# Patient Record
Sex: Male | Born: 1990 | Race: Black or African American | Hispanic: No | Marital: Married | Smoking: Never smoker
Health system: Southern US, Community
[De-identification: ages and names within clinical notes are randomized; demographics above are authoritative.]

## PROBLEM LIST (undated history)

## (undated) DIAGNOSIS — R569 Unspecified convulsions: Secondary | ICD-10-CM

---
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
## 2010-05-25 LAB — CBC
HCT: 39.7 % (ref 39.0–52.0)
Hemoglobin: 13.7 g/dL (ref 13.0–17.0)
MCHC: 34.6 g/dL (ref 30.0–36.0)
MCV: 83.4 fL (ref 78.0–100.0)
Platelets: 134 10*3/uL — ABNORMAL LOW (ref 150–400)
RBC: 4.76 MIL/uL (ref 4.22–5.81)
RDW: 13.1 % (ref 11.5–15.5)
WBC: 4.1 10*3/uL (ref 4.0–10.5)

## 2010-05-25 LAB — DIFFERENTIAL
Basophils Absolute: 0 10*3/uL (ref 0.0–0.1)
Basophils Relative: 0 % (ref 0–1)
Eosinophils Absolute: 0.1 10*3/uL (ref 0.0–0.7)
Eosinophils Relative: 3 % (ref 0–5)
Lymphocytes Relative: 26 % (ref 12–46)
Lymphs Abs: 1.1 10*3/uL (ref 0.7–4.0)
Monocytes Absolute: 0.6 10*3/uL (ref 0.1–1.0)
Monocytes Relative: 15 % — ABNORMAL HIGH (ref 3–12)
Neutro Abs: 2.3 10*3/uL (ref 1.7–7.7)
Neutrophils Relative %: 56 % (ref 43–77)

## 2010-05-25 LAB — VALPROIC ACID LEVEL: Valproic Acid Lvl: 88 ug/mL (ref 50.0–100.0)

## 2010-05-25 LAB — BASIC METABOLIC PANEL WITH GFR
BUN: 9 mg/dL (ref 6–23)
CO2: 28 meq/L (ref 19–32)
Calcium: 9 mg/dL (ref 8.4–10.5)
Chloride: 105 meq/L (ref 96–112)
Creatinine, Ser: 0.87 mg/dL (ref 0.4–1.5)
GFR calc non Af Amer: >60 mL/min
Glucose, Bld: 88 mg/dL (ref 70–99)
Potassium: 3.7 meq/L (ref 3.5–5.1)
Sodium: 139 meq/L (ref 135–145)

## 2010-05-27 LAB — VALPROIC ACID LEVEL: Valproic Acid Lvl: 49 ug/mL — ABNORMAL LOW (ref 50.0–100.0)

## 2010-06-01 LAB — COMPREHENSIVE METABOLIC PANEL
ALT: 18 U/L (ref 0–53)
AST: 21 U/L (ref 0–37)
Albumin: 4 g/dL (ref 3.5–5.2)
Alkaline Phosphatase: 72 U/L (ref 39–117)
BUN: 9 mg/dL (ref 6–23)
CO2: 26 mEq/L (ref 19–32)
Calcium: 9.5 mg/dL (ref 8.4–10.5)
Chloride: 103 mEq/L (ref 96–112)
Creatinine, Ser: 1.02 mg/dL (ref 0.4–1.5)
GFR calc Af Amer: >60 mL/min (ref >60)
GFR calc non Af Amer: >60 mL/min (ref >60)
Glucose, Bld: 103 mg/dL — ABNORMAL HIGH (ref 70–99)
Potassium: 4 mEq/L (ref 3.5–5.1)
Sodium: 138 mEq/L (ref 135–145)
Total Bilirubin: 0.5 mg/dL (ref 0.3–1.2)
Total Protein: 7.1 g/dL (ref 6.0–8.3)

## 2010-06-01 LAB — RAPID URINE DRUG SCREEN, HOSP PERFORMED
Amphetamines: NOT DETECTED
Barbiturates: NOT DETECTED
Opiates: NOT DETECTED

## 2010-06-01 LAB — CBC
HCT: 41.5 % (ref 39.0–52.0)
Hemoglobin: 14.4 g/dL (ref 13.0–17.0)
MCHC: 34.8 g/dL (ref 30.0–36.0)
MCV: 83.8 fL (ref 78.0–100.0)
Platelets: 172 10*3/uL (ref 150–400)
RBC: 4.96 MIL/uL (ref 4.22–5.81)
RDW: 12.7 % (ref 11.5–15.5)
WBC: 7 10*3/uL (ref 4.0–10.5)

## 2010-06-01 LAB — DIFFERENTIAL
Basophils Relative: 0 % (ref 0–1)
Eosinophils Absolute: 0.3 10*3/uL (ref 0.0–0.7)
Lymphs Abs: 2.5 10*3/uL (ref 0.7–4.0)
Monocytes Relative: 9 % (ref 3–12)
Neutro Abs: 3.6 10*3/uL (ref 1.7–7.7)
Neutrophils Relative %: 51 % (ref 43–77)

## 2010-06-01 LAB — PHENYTOIN LEVEL, TOTAL: Phenytoin Lvl: <2.5 ug/mL — ABNORMAL LOW (ref 10.0–20.0)
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
## 2010-07-25 NOTE — Discharge Summary (Signed)
NAME:  SHAH, INSLEY                     ACCOUNT NO.:  [id number]   MEDICAL RECORD NO.:  [id number]                   PATIENT TYPE:  INP   LOCATION:  0601                                 FACILITY:  [facility]   PHYSICIAN:  Cindie Crumbly, M.D.               DATE OF BIRTH:  [birth year]   DATE OF ADMISSION:  06/08/2002  DATE OF DISCHARGE:  06/15/2002                                 DISCHARGE SUMMARY   REASON FOR ADMISSION:  This 20 year old African-American male was admitted  complaining of depression status post suicide attempt by trying to hang  himself with an electrical cord.  For further history of present illness,  please see the patient's psychiatry admission assessment.   PHYSICAL EXAMINATION:  At the time of admission was significant for a  history of a seizure disorder well controlled on Tegretol and was otherwise  unremarkable.   LABORATORY EXAMINATION:  The patient underwent a laboratory workup to rule  out any other medical problems contributing to his symptomatology.  Hepatic  panel was within normal limits.  A UA was unremarkable.  Basic metabolic  panel was within normal limits.  A CBC showed an MCHC of 34.5, white count  of 4.3 thousand, eosinophil count of 7% and was otherwise unremarkable.  Free T4 was 0.79, TSH was 2.719.  GGT was within normal limits.  The patient  received no x-rays, no special procedures, no additional consultations.  He  sustained no complications during the course of this hospitalization.   HOSPITAL COURSE:  On admission, the patient was psychomotor retarded.  Speech was coherent with a decreased rate and volume of speech, increased  speech latency.  He displayed decreased concentration, poor impulse control.  His affect and mood were depressed, irritable and angry.  He was continued  on a trial of Zoloft and titrated up to a therapeutic dose.  He tolerated  this medication well without side effects.  At the time of discharge, his  psychomotor retardation has resolved.  Affect and mood have improved.  He is  actively participating in all aspects of the therapeutic treatment program,  no longer appears to be a danger to himself or others.  He has displayed no  evidence of a thought disorder throughout his hospital course,  and  consequently is felt to have reached his maximum benefits of hospitalization  and is ready for discharge to a less restricted alternative setting.   CONDITION ON DISCHARGE:  Improved.   DISCHARGE DIAGNOSES:   AXIS I:  1. Major depression, single episode, severe, without psychosis.  2. Oppositional-defiant disorder.   AXIS II:  1. Rule out learning disorder not otherwise specified.  2. Rule out personality disorder not otherwise specified.   AXIS III:  1. Seizure disorder.  2. Status post closed head trauma.   AXIS IV:  Current psychosocial stressors are severe.   AXIS V:  Code 20 on admission, code 30 on discharge.  FURTHER EVALUATION AND TREATMENT RECOMMENDATIONS:  1. The patient is discharged to home.  2. He is discharged on an unrestricted level of activity and a regular diet.  3. He will follow up with Dr. Charlotte Sanes at [facility]     for all further aspects of his psychiatric care and consequently I will     sign off on the case at this time.   DISCHARGE MEDICATIONS:  1. Zoloft 100 mg p.o. daily.  2. Tegretol 200 mg each morning, 100 mg at noon, and 200 mg q.h.s.                                                 Cindie Crumbly, M.D.    TS/MEDQ  D:  06/15/2002  T:  06/15/2002  Job:  [id number]
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
## 2010-07-25 NOTE — Procedures (Signed)
ATTENDING:  Deanna Artis. Sharene Skeans, M.D.   CLINICAL HISTORY:  This is a 20 year old with a mood disorder who was found  lying down with his eyes rolled back.  Study is being done to look for the  presence of seizure.   PROCEDURE:  The tracing was carried out on a 32-channel digital Cadwell  recorder reformatted into 15-channel montages with one devoted to EKG.  The  patient was awake and asleep during the recording.  The International 10-20  system lead placement was used.   DESCRIPTION OF FINDINGS:  Frequency is an 8- to 9-Hz, 20- to 25-microvolt  activity that is well-regulated and attenuates partially with eye opening.   Background activity shows a low-voltage mixture of alpha- and theta-range  activity with admixed beta.  The patient becomes drowsy during portions of  the record and drifts into natural sleep with vertex sharp waves and  symmetric and synchronous sleep spindles.  There was no focal slowing. There  was no interictal epileptiform activity in the form of spikes or sharp  waves.  Activating procedures were not carried out.  EKG showed a regular  sinus rhythm with ventricular response of 72 beats per minute.   IMPRESSION:  In the waking state and sleep, this record is normal.    Chrissie Noa H. Sharene Skeans, M.D.   EAV:WUJW  D:  01/08/2004 09:02:26  T:  01/08/2004 09:58:08  Job #:  [id number]   cc:   Behavioral Health   Beverly Milch, MD   Marolyn Hammock. Thad Ranger, M.D.  [address]  [address]  [place]  [place] [address]  Fax: [phone]
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
## 2010-07-25 NOTE — Procedures (Signed)
NAMEKAYSTON, JODOIN           ACCOUNT NO.:  [id number]   MEDICAL RECORD NO.:  [id number]          PATIENT TYPE:  EMS   LOCATION:  ED                            FACILITY:  [facility]   PHYSICIAN:  Edward L. Juanetta Gosling, M.D.DATE OF BIRTH:  October 08, 1990   DATE OF PROCEDURE:  DATE OF DISCHARGE:  02/17/2005                                EKG INTERPRETATION   It should be noted the patient's age is listed as 14 years. The rhythm is  sinus rhythm with a rate in the 50s. There is left ventricular hypertrophy  strictly by voltage criteria. T-wave abnormalities are seen which are  nonspecific. There is possible early repolarization. Abnormal  electrocardiogram.      Oneal Deputy. Juanetta Gosling, M.D.  Electronically Signed     ELH/MEDQ  D:  02/18/2005  T:  02/18/2005  Job:  [id number]

## 2010-11-21 ENCOUNTER — Emergency Department (HOSPITAL_COMMUNITY)
Admission: EM | Admit: 2010-11-21 | Discharge: 2010-11-21 | Discharge status: Against Medical Advice | Payer: Self-pay | Attending: Emergency Medicine | Admitting: Emergency Medicine

## 2010-11-21 DIAGNOSIS — Z5320 Procedure and treatment not carried out because of patient's decision for unspecified reasons: Secondary | ICD-10-CM | POA: Insufficient documentation

## 2010-11-21 NOTE — ED Notes (Signed)
Patient decided to leave prior to triage due to extended wait time

## 2010-11-22 ENCOUNTER — Encounter: Payer: Self-pay | Admitting: *Deleted

## 2010-11-22 ENCOUNTER — Emergency Department (HOSPITAL_COMMUNITY)
Admission: EM | Admit: 2010-11-22 | Discharge: 2010-11-22 | Disposition: A | Discharge status: Home / Self Care | Payer: Self-pay | Attending: Emergency Medicine | Admitting: Emergency Medicine

## 2010-11-22 DIAGNOSIS — T6391XA Toxic effect of contact with unspecified venomous animal, accidental (unintentional), initial encounter: Secondary | ICD-10-CM | POA: Insufficient documentation

## 2010-11-22 DIAGNOSIS — T63391A Toxic effect of venom of other spider, accidental (unintentional), initial encounter: Secondary | ICD-10-CM | POA: Insufficient documentation

## 2010-11-22 DIAGNOSIS — L089 Local infection of the skin and subcutaneous tissue, unspecified: Secondary | ICD-10-CM | POA: Insufficient documentation

## 2010-11-22 HISTORY — DX: Unspecified convulsions: R56.9

## 2010-11-22 MED ORDER — DOXYCYCLINE HYCLATE 100 MG PO CAPS: 100.0000 mg | ORAL_CAPSULE | Freq: Two times a day (BID) | ORAL | Status: AC

## 2010-11-22 NOTE — ED Notes (Signed)
Pt states noticed ? Spider bite on right hip x 3 days ago with drainage.  States was at work yesterday and became dizzy/lightheaded with blurry vision.

## 2010-11-22 NOTE — ED Provider Notes (Signed)
Scribed for Benny Lennert, MD, the patient was seen in room APA03/[facility]. This chart was scribed by [employer]. The patient's care started at 10:15  CSN: [id number] Arrival date & time: 11/22/2010 10:03 AM   Chief Complaint  Patient presents with  . Insect Bite      HPI Eddie [name] is a 20 y.o. male with a history of seizures who presents to the Emergency Department complaining of insect bite to the right hip and right thigh with associated lightheadedness and cough, onset 11/20/2010. Patient associates bite with intermittent pain at its site. Patient states he was at work yesterday when he became lightheaded. Patient is in no acute distress.  HPI ELEMENTS:  Location: Right hip  Onset: 11/20/2010 Duration: 3 days  Timing: intermittent  Context:  as above  Associated symptoms: as above     HPI   Past Medical History  Diagnosis Date  . Seizures      History reviewed. No pertinent past surgical history.  No family history on file.  History  Substance Use Topics  . Smoking status: Never Smoker   . Smokeless tobacco: Not on file  . Alcohol Use: No      Review of Systems  Constitutional: Negative for chills and fatigue.  HENT: Negative for congestion, sinus pressure and ear discharge.   Eyes: Negative for discharge.  Respiratory: Positive for cough.   Cardiovascular: Negative for chest pain.  Gastrointestinal: Negative for vomiting, abdominal pain and diarrhea.  Genitourinary: Negative for frequency and hematuria.  Musculoskeletal: Negative for back pain.  Skin: Positive for rash (dime sized, on side of right buttock and right thigh).  Neurological: Positive for light-headedness. Negative for seizures and headaches.  Hematological: Negative.   Psychiatric/Behavioral: Negative for hallucinations.  All other systems reviewed and are negative.    Allergies  Review of patient's allergies indicates no known allergies.  Home Medications  No current  outpatient prescriptions on file.  Physical Exam    BP 132/72  Pulse 58  Temp(Src) 98 F (36.7 C) (Oral)  Resp 16  Ht 5\' 6"  (1.676 m)  Wt 165 lb (74.844 kg)  BMI 26.63 kg/m2  SpO2 100%  Physical Exam  Constitutional: He is oriented to person, place, and time. He appears well-developed.  HENT:  Head: Normocephalic and atraumatic.  Mouth/Throat: Oropharynx is clear and moist. No oropharyngeal exudate.  Eyes: Conjunctivae and EOM are normal. Pupils are equal, round, and reactive to light. No scleral icterus.  Neck: Neck supple. No thyromegaly present.  Cardiovascular: Normal rate, regular rhythm and normal heart sounds.  Exam reveals no gallop and no friction rub.   No murmur heard. Pulmonary/Chest: Effort normal and breath sounds normal. No stridor. No respiratory distress. He has no wheezes. He has no rales. He exhibits no tenderness.  Abdominal: Soft. Bowel sounds are normal. He exhibits no distension. There is no tenderness. There is no rebound.  Musculoskeletal: Normal range of motion. He exhibits no edema.  Lymphadenopathy:    He has no cervical adenopathy.  Neurological: He is alert and oriented to person, place, and time. No cranial nerve deficit. Coordination normal.  Skin: Skin is warm and dry. Rash (indurated macular rash on the side of the right buttok and right thigh. About the size of a dime) noted. He is not diaphoretic. No erythema.  Psychiatric: He has a normal mood and affect. His behavior is normal.    ED Course  Procedures  Results for orders placed during the hospital encounter of  07/02/09  VALPROIC ACID LEVEL      Component Value Range   Valproic Acid Lvl 49.0 (*) 50.0 - 100.0 (ug/mL)   OTHER DATA REVIEWED: Nursing notes, vital signs, and past medical records reviewed.    DIAGNOSTIC STUDIES: Oxygen Saturation is 100% on room air, normal by my interpretation.     ED COURSE / COORDINATION OF CARE: 10:30 - EDMD examined site of bite. 100mg  doxycycline  capsule ordered.    IMPRESSION: Diagnoses that have been ruled out:  Diagnoses that are still under consideration:  Final diagnoses:  Unspecified local infection of skin and subcutaneous tissue    PLAN:  Home  The patient is to return the emergency department if there is any worsening of symptoms. I have reviewed the discharge instructions with the patient  CONDITION ON DISCHARGE: Stable  MEDICATIONS GIVEN IN THE E.D.  Medications  ibuprofen (ADVIL,MOTRIN) 200 MG tablet (not administered)  doxycycline (VIBRAMYCIN) 100 MG capsule (not administered)    DISCHARGE MEDICATIONS: New Prescriptions   DOXYCYCLINE (VIBRAMYCIN) 100 MG CAPSULE    Take 1 capsule (100 mg total) by mouth 2 (two) times daily.    The chart was scribed for me under my direct supervision.  I personally performed the history, physical, and medical decision making and all procedures in the evaluation of this patient.Benny Lennert, MD 11/22/10 [phone]

## 2010-12-01 LAB — PHENYTOIN LEVEL, TOTAL: Phenytoin Lvl: 4.9 — ABNORMAL LOW

## 2010-12-02 LAB — CBC
HCT: 39.7
Hemoglobin: 13.8
MCHC: 34.7
MCV: 85
RBC: 4.67
WBC: 3.5 — ABNORMAL LOW

## 2010-12-02 LAB — BASIC METABOLIC PANEL
CO2: 25
Chloride: 105
Creatinine, Ser: 0.76
Potassium: 3.8

## 2010-12-02 LAB — DIFFERENTIAL
Band Neutrophils: 0
Basophils Absolute: 0
Basophils Relative: 0
Eosinophils Absolute: 0.1
Eosinophils Relative: 3
Metamyelocytes Relative: 0
Myelocytes: 0
Neutro Abs: 1 — ABNORMAL LOW
Promyelocytes Absolute: 0
Smear Review: DECREASED

## 2010-12-02 LAB — PHENYTOIN LEVEL, TOTAL: Phenytoin Lvl: 8.6 — ABNORMAL LOW

## 2010-12-04 LAB — DIFFERENTIAL
Basophils Relative: 0
Eosinophils Absolute: 0.1
Eosinophils Absolute: 0.3
Eosinophils Relative: 3
Lymphs Abs: 1.2
Lymphs Abs: 1.4
Monocytes Absolute: 0.8
Monocytes Relative: 9
Neutro Abs: 2.7
Neutrophils Relative %: 58
Neutrophils Relative %: 75 — ABNORMAL HIGH

## 2010-12-04 LAB — CBC
HCT: 41.5
Hemoglobin: 14.6
MCV: 82.9
MCV: 83.6
Platelets: 167
RBC: 5
WBC: 4.8
WBC: 9.1

## 2010-12-04 LAB — VALPROIC ACID LEVEL: Valproic Acid Lvl: <10 — ABNORMAL LOW

## 2010-12-04 LAB — BASIC METABOLIC PANEL
BUN: 8
Calcium: 9.2
Chloride: 102
Chloride: 105
Creatinine, Ser: 1
Potassium: 3.5
Sodium: 136

## 2010-12-04 LAB — ETHANOL
Alcohol, Ethyl (B): <5
Alcohol, Ethyl (B): <5

## 2010-12-04 LAB — RAPID URINE DRUG SCREEN, HOSP PERFORMED
Amphetamines: NOT DETECTED
Barbiturates: NOT DETECTED
Barbiturates: NOT DETECTED
Benzodiazepines: NOT DETECTED

## 2010-12-04 LAB — URINALYSIS, ROUTINE W REFLEX MICROSCOPIC
Ketones, ur: NEGATIVE
Nitrite: NEGATIVE
Protein, ur: NEGATIVE

## 2010-12-08 LAB — CBC
HCT: 44.5
MCHC: 33.5
MCV: 84.5
Platelets: 199
WBC: 4.7

## 2010-12-08 LAB — BASIC METABOLIC PANEL
CO2: 30
Calcium: 9.6
Creatinine, Ser: 0.85

## 2010-12-10 LAB — POCT I-STAT, CHEM 8
Chloride: 104
Creatinine, Ser: 1.1
Glucose, Bld: 83
Hemoglobin: 13.9
Potassium: 4

## 2010-12-16 LAB — DIFFERENTIAL
Basophils Absolute: 0
Basophils Absolute: 0
Eosinophils Absolute: 0.2
Eosinophils Absolute: 0.2
Eosinophils Relative: 5
Lymphs Abs: 1.6
Neutrophils Relative %: 45

## 2010-12-16 LAB — CBC
HCT: 38.5
MCHC: 34.6
MCV: 81.7
MCV: 83.2
Platelets: 170
Platelets: 191
RDW: 13.1
WBC: 4.2 — ABNORMAL LOW

## 2010-12-16 LAB — PHENYTOIN LEVEL, TOTAL
Phenytoin Lvl: <2.5 — ABNORMAL LOW
Phenytoin Lvl: <2.5 — ABNORMAL LOW

## 2010-12-16 LAB — BASIC METABOLIC PANEL
BUN: 5 — ABNORMAL LOW
BUN: 9
Chloride: 103
Chloride: 105
Creatinine, Ser: 0.79
Glucose, Bld: 94
Potassium: 3.4 — ABNORMAL LOW

## 2010-12-17 LAB — DIFFERENTIAL
Lymphocytes Relative: 36
Lymphs Abs: 1.6
Monocytes Relative: 11 — ABNORMAL HIGH
Neutro Abs: 2.1
Neutrophils Relative %: 49

## 2010-12-17 LAB — BASIC METABOLIC PANEL
Calcium: 9.1
Chloride: 107
Creatinine, Ser: 0.78

## 2010-12-17 LAB — CBC
MCV: 82.8
RBC: 4.79
WBC: 4.4

## 2010-12-18 LAB — DIFFERENTIAL
Basophils Relative: 0
Eosinophils Absolute: 0.1
Lymphs Abs: 1.2
Monocytes Absolute: 0.4
Monocytes Relative: 9
Neutro Abs: 2.8

## 2010-12-18 LAB — BASIC METABOLIC PANEL
CO2: 28
Chloride: 105
Sodium: 140

## 2010-12-18 LAB — CBC
Hemoglobin: 15.6
MCHC: 34.1
MCV: 83.4
RBC: 5.48
WBC: 4.4

## 2010-12-19 LAB — BASIC METABOLIC PANEL
BUN: 8
CO2: 27
Glucose, Bld: 94
Potassium: 3.3 — ABNORMAL LOW

## 2010-12-19 LAB — DIFFERENTIAL
Lymphs Abs: 1.4
Monocytes Relative: 13 — ABNORMAL HIGH
Neutro Abs: 2.1
Neutrophils Relative %: 49

## 2010-12-19 LAB — CBC: MCV: 82.3

## 2010-12-31 ENCOUNTER — Emergency Department (HOSPITAL_COMMUNITY)
Admission: EM | Admit: 2010-12-31 | Discharge: 2010-12-31 | Disposition: A | Discharge status: Home / Self Care | Payer: Medicaid Other | Attending: Emergency Medicine | Admitting: Emergency Medicine

## 2010-12-31 ENCOUNTER — Encounter (HOSPITAL_COMMUNITY): Payer: Self-pay | Admitting: *Deleted

## 2010-12-31 ENCOUNTER — Emergency Department (HOSPITAL_COMMUNITY): Payer: Medicaid Other

## 2010-12-31 DIAGNOSIS — IMO0002 Reserved for concepts with insufficient information to code with codable children: Secondary | ICD-10-CM | POA: Insufficient documentation

## 2010-12-31 DIAGNOSIS — S46919A Strain of unspecified muscle, fascia and tendon at shoulder and upper arm level, unspecified arm, initial encounter: Secondary | ICD-10-CM

## 2010-12-31 MED ORDER — IBUPROFEN 800 MG PO TABS
800.0000 mg | ORAL_TABLET | Freq: Once | ORAL | Status: AC
  Administered 2010-12-31: 800 mg via ORAL
  Filled 2010-12-31: qty 1

## 2010-12-31 MED ORDER — HYDROCODONE-ACETAMINOPHEN 5-325 MG PO TABS: 1.0000 | ORAL_TABLET | ORAL | Status: AC | PRN

## 2010-12-31 MED ORDER — METHOCARBAMOL 500 MG PO TABS
1000.0000 mg | ORAL_TABLET | Freq: Once | ORAL | Status: AC
  Administered 2010-12-31: 1000 mg via ORAL
  Filled 2010-12-31: qty 2

## 2010-12-31 MED ORDER — CYCLOBENZAPRINE HCL 5 MG PO TABS: 5.0000 mg | ORAL_TABLET | Freq: Three times a day (TID) | ORAL | Status: AC | PRN

## 2010-12-31 MED ORDER — DICLOFENAC SODIUM 75 MG PO TBEC: 75.0000 mg | DELAYED_RELEASE_TABLET | Freq: Two times a day (BID) | ORAL | Status: DC

## 2010-12-31 MED ORDER — HYDROCODONE-ACETAMINOPHEN 5-325 MG PO TABS
2.0000 | ORAL_TABLET | Freq: Once | ORAL | Status: AC
  Administered 2010-12-31: 2 via ORAL
  Filled 2010-12-31: qty 2

## 2010-12-31 NOTE — ED Provider Notes (Signed)
Medical screening examination/treatment/procedure(s) were performed by non-physician practitioner and as supervising physician I was immediately available for consultation/collaboration.  Emalee Knies R. Conrad Zajkowski, MD 12/31/10 [id number] 

## 2010-12-31 NOTE — ED Notes (Signed)
Pt states he was working on a car transmission and it came loose and hit his shoulder.

## 2010-12-31 NOTE — ED Provider Notes (Signed)
History     CSN: [id number] Arrival date & time: 12/31/2010  5:19 PM   None     Chief Complaint  Patient presents with  . Shoulder Injury    right    (Consider location/radiation/quality/duration/timing/severity/associated sxs/prior treatment) Patient is a 20 y.o. male presenting with shoulder injury. The history is provided by the patient.  Shoulder Injury This is a new problem. The current episode started today. The problem occurs constantly. The problem has been gradually worsening. Pertinent negatives include no abdominal pain, arthralgias, chest pain, coughing or neck pain. Exacerbated by: Movement and certain positions. He has tried nothing for the symptoms.    Past Medical History  Diagnosis Date  . Seizures     History reviewed. No pertinent past surgical history.  History reviewed. No pertinent family history.  History  Substance Use Topics  . Smoking status: Never Smoker   . Smokeless tobacco: Not on file  . Alcohol Use: No      Review of Systems  Constitutional: Negative for activity change.       All ROS Neg except as noted in HPI  HENT: Negative for nosebleeds and neck pain.   Eyes: Negative for photophobia and discharge.  Respiratory: Negative for cough, shortness of breath and wheezing.   Cardiovascular: Negative for chest pain and palpitations.  Gastrointestinal: Negative for abdominal pain and blood in stool.  Genitourinary: Negative for dysuria, frequency and hematuria.  Musculoskeletal: Negative for back pain and arthralgias.  Skin: Negative.   Neurological: Negative for dizziness, seizures and speech difficulty.  Psychiatric/Behavioral: Negative for hallucinations and confusion.    Allergies  Review of patient's allergies indicates no known allergies.  Home Medications   Current Outpatient Rx  Name Route Sig Dispense Refill  . CYCLOBENZAPRINE HCL 5 MG PO TABS Oral Take 1 tablet (5 mg total) by mouth 3 (three) times daily as needed for  muscle spasms. 15 tablet 0  . DICLOFENAC SODIUM 75 MG PO TBEC Oral Take 1 tablet (75 mg total) by mouth 2 (two) times daily after a meal. 14 tablet 0  . HYDROCODONE-ACETAMINOPHEN 5-325 MG PO TABS Oral Take 1 tablet by mouth every 4 (four) hours as needed for pain. 20 tablet 0  . IBUPROFEN 200 MG PO TABS Oral Take 400 mg by mouth every 6 (six) hours as needed. For headache       BP 128/75  Pulse 81  Temp(Src) 98.6 F (37 C) (Oral)  Resp 20  Ht 5\' 7"  (1.702 m)  Wt 165 lb (74.844 kg)  BMI 25.84 kg/m2  SpO2 99%  Physical Exam  Nursing note and vitals reviewed. Constitutional: He is oriented to person, place, and time. He appears well-developed and well-nourished.  Non-toxic appearance.  HENT:  Head: Normocephalic.  Right Ear: Tympanic membrane and external ear normal.  Left Ear: Tympanic membrane and external ear normal.  Eyes: EOM and lids are normal. Pupils are equal, round, and reactive to light.  Neck: Normal range of motion. Neck supple. Carotid bruit is not present.  Cardiovascular: Normal rate, regular rhythm, normal heart sounds, intact distal pulses and normal pulses.   Pulmonary/Chest: Breath sounds normal. No respiratory distress.  Abdominal: Soft. Bowel sounds are normal. There is no tenderness. There is no guarding.  Musculoskeletal: Normal range of motion.       Pain to palpation just under the right scapula and at the anterior right shoulder. No palpable deformity. Distal pulses and sensory wnl.  Lymphadenopathy:  Head (right side): No submandibular adenopathy present.       Head (left side): No submandibular adenopathy present.    He has no cervical adenopathy.  Neurological: He is alert and oriented to person, place, and time. He has normal strength. No cranial nerve deficit or sensory deficit.  Skin: Skin is warm and dry.  Psychiatric: He has a normal mood and affect. His speech is normal.    ED Course  Procedures (including critical care time)  Labs  Reviewed - No data to display Dg Shoulder Right  12/31/2010  *RADIOLOGY REPORT*  Clinical Data: Shoulder injury  RIGHT SHOULDER - 2+ VIEW  Comparison: None.  Findings: Glenohumeral joint is intact.  No evidence of scapular fracture  or humeral fracture.  The acromioclavicular joint is intact.  IMPRESSION: No fracture or dislocation.  Original Report Authenticated By: Genevive Bi, M.D.     1. Shoulder strain       MDM  I have reviewed nursing notes, vital signs, and all appropriate lab and imaging results for this patient.        Kathie Dike, [place] 12/31/10 [phone]

## 2011-06-09 ENCOUNTER — Encounter (HOSPITAL_COMMUNITY): Payer: Self-pay | Admitting: Emergency Medicine

## 2011-06-09 ENCOUNTER — Emergency Department (HOSPITAL_COMMUNITY)
Admission: EM | Admit: 2011-06-09 | Discharge: 2011-06-10 | Disposition: A | Discharge status: Home / Self Care | Payer: Self-pay | Attending: Emergency Medicine | Admitting: Emergency Medicine

## 2011-06-09 ENCOUNTER — Emergency Department (HOSPITAL_COMMUNITY): Payer: Self-pay

## 2011-06-09 DIAGNOSIS — R569 Unspecified convulsions: Secondary | ICD-10-CM | POA: Insufficient documentation

## 2011-06-09 DIAGNOSIS — R0602 Shortness of breath: Secondary | ICD-10-CM | POA: Insufficient documentation

## 2011-06-09 DIAGNOSIS — R059 Cough, unspecified: Secondary | ICD-10-CM | POA: Insufficient documentation

## 2011-06-09 DIAGNOSIS — R509 Fever, unspecified: Secondary | ICD-10-CM | POA: Insufficient documentation

## 2011-06-09 DIAGNOSIS — J069 Acute upper respiratory infection, unspecified: Secondary | ICD-10-CM | POA: Insufficient documentation

## 2011-06-09 DIAGNOSIS — R05 Cough: Secondary | ICD-10-CM | POA: Insufficient documentation

## 2011-06-09 LAB — CBC
MCH: 27.6 pg (ref 26.0–34.0)
MCHC: 33.7 g/dL (ref 30.0–36.0)
Platelets: 164 10*3/uL (ref 150–400)

## 2011-06-09 LAB — COMPREHENSIVE METABOLIC PANEL
Albumin: 3.9 g/dL (ref 3.5–5.2)
Alkaline Phosphatase: 82 U/L (ref 39–117)
BUN: 8 mg/dL (ref 6–23)
Potassium: 3.4 mEq/L — ABNORMAL LOW (ref 3.5–5.1)
Sodium: 138 mEq/L (ref 135–145)
Total Protein: 7.5 g/dL (ref 6.0–8.3)

## 2011-06-09 LAB — DIFFERENTIAL
Basophils Relative: 0 % (ref 0–1)
Eosinophils Absolute: 0.1 10*3/uL (ref 0.0–0.7)
Monocytes Relative: 13 % — ABNORMAL HIGH (ref 3–12)
Neutrophils Relative %: 62 % (ref 43–77)

## 2011-06-09 MED ORDER — SODIUM CHLORIDE 0.9 % IV SOLN: INTRAVENOUS | Status: DC

## 2011-06-09 MED ORDER — SODIUM CHLORIDE 0.9 % IV BOLUS (SEPSIS)
1000.0000 mL | Freq: Once | INTRAVENOUS | Status: AC
  Administered 2011-06-09: 1000 mL via INTRAVENOUS

## 2011-06-09 NOTE — ED Notes (Signed)
Patient back to room from radiology. Denies any needs. No distress. Equal chest rise and fall, regular, unlabored. Wife at bedside. Call bell within reach. Bed in low position and locked with side rails up. Will continue to monitor.

## 2011-06-09 NOTE — ED Notes (Signed)
MD at bedside to evaluate.

## 2011-06-09 NOTE — ED Notes (Signed)
Patient transported to CT via stretcher. Denies any needs. No additional episodes of seizure activity. A&O x 4.

## 2011-06-09 NOTE — ED Notes (Signed)
Patient resting sitting up in bed. Looking around. A&Ox4 but tired. Per EMS, wife found him shaking and eyes rolled back. Last seizure was two years ago and usually comes on when being sick. Patient has had a head and chest cold for the past few days with temperature. Clear lung sounds in all fields. No respiratory difficulties.

## 2011-06-09 NOTE — ED Provider Notes (Signed)
History  This chart was scribed for Eddie Jakes, MD by Bennett Scrape. This patient was seen in room APA04/APA04 and the patient's care was started at 11:20PM.  CSN: [id number]  Arrival date & time 06/09/11  2213   First MD Initiated Contact with Patient 06/09/11 2305      Chief Complaint  Patient presents with  . Seizures    Patient is a 21 y.o. male presenting with seizures. The history is provided by the patient. No language interpreter was used.  Seizures  This is a new problem. The current episode started less than 1 hour ago. The problem has been gradually improving. There was 1 seizure. The most recent episode lasted 2 to 5 minutes. Associated symptoms include headaches, sore throat and cough (productive). Pertinent negatives include no confusion, no chest pain, no nausea, no vomiting and no diarrhea. Characteristics include rhythmic jerking and loss of consciousness. Characteristics do not include bowel incontinence, bladder incontinence or bit tongue. The episode was witnessed. The seizures did not continue in the ED. The seizure(s) had no focality. Possible causes include recent illness. The maximum temperature recorded prior to his arrival was 100 to 100.9 F. The fever has been present for 1 to 2 days. There were no medications administered prior to arrival.    Eddie [name] is a 21 y.o. male brought in by ambulance, who presents to the Emergency Department complaining of one episode of seizure like activity that occurred while pt was laying down approximately 30 minutes PTA. Seizure was witnessed by wife who states that the pt was shaking all over. She confirms LOC during the episode. She states that the episode lasted between 2 to 5 minutes. Pt was awake before being taken out of the house by EMS. Pt has a h/o seizures but is not on medication for seizures. He states that the seizures started after a head injury in 2002. His last seizures was 2 years ago. He states that  he was being followed by [facility] with the last visit being last year and that he wasn't placed on medications, because the seizures were not happening often. Pt denies having any tongue injury. He denies having bowel or urinary incontinence. He c/o 2 days of cold like symptoms described as fever, sore throat, nasal congestion, mild productive cough of phlegm, mild body aches, intermittent HA and occasional SOB as associated symptoms. Pt reports that he has a h/o strep throat. Wife reports that the pt has recently started taking day quill and night quill for the cold symptoms.  He denies neck pain, back pain,  abdominal pain, nausea, emesis, diarrhea, rash, leg swelling, and dysuria as associated symptoms. He has no other h/o chronic medical conditions. He denies smoking and alcohol use.    Past Medical History  Diagnosis Date  . Seizures     History reviewed. No pertinent past surgical history.  No family history on file.  History  Substance Use Topics  . Smoking status: Never Smoker   . Smokeless tobacco: Not on file  . Alcohol Use: No      Review of Systems  Constitutional: Positive for fever. Negative for chills.  HENT: Positive for congestion and sore throat. Negative for neck pain.   Eyes: Negative for pain.  Respiratory: Positive for cough (productive) and shortness of breath (ocassionally).   Cardiovascular: Negative for chest pain.  Gastrointestinal: Negative for nausea, vomiting, abdominal pain, diarrhea and bowel incontinence.  Genitourinary: Negative for bladder incontinence, dysuria, urgency and hematuria.  Musculoskeletal: Negative for back pain.  Skin: Negative for rash.  Neurological: Positive for seizures, loss of consciousness and headaches.  Hematological: Does not bruise/bleed easily.  Psychiatric/Behavioral: Negative for confusion.    Allergies  Depakote  Home Medications   Current Outpatient Rx  Name Route Sig Dispense Refill  .  DAYQUIL/NYQUIL COLD/FLU RELIEF PO Oral Take 30 mLs by mouth every 4 (four) hours as needed. For cold symptoms      Triage Vitals: BP 135/71  Pulse 84  Temp(Src) 99.4 F (37.4 C) (Oral)  Resp 20  Ht 5\' 10"  (1.778 m)  Wt 150 lb (68.04 kg)  BMI 21.52 kg/m2  SpO2 97%  Physical Exam  Nursing note and vitals reviewed. Constitutional: He is oriented to person, place, and time. He appears well-developed and well-nourished.  HENT:  Head: Normocephalic and atraumatic.  Mouth/Throat: No oropharyngeal exudate.       Moist mucous membranes, mild irritation around bilateral nares, pt has a hoarse voice  Eyes: Conjunctivae and EOM are normal.  Neck: Normal range of motion. Neck supple.  Cardiovascular: Normal rate, regular rhythm and normal heart sounds.   Pulmonary/Chest: Effort normal and breath sounds normal. No respiratory distress. He has no wheezes.  Abdominal: Soft. There is no tenderness.  Musculoskeletal: Normal range of motion. He exhibits no edema.  Lymphadenopathy:    He has no cervical adenopathy.  Neurological: He is alert and oriented to person, place, and time. No cranial nerve deficit.  Skin: Skin is warm and dry. No rash noted.  Psychiatric: He has a normal mood and affect. His behavior is normal.    ED Course  Procedures (including critical care time)  DIAGNOSTIC STUDIES: Oxygen Saturation is 97% on room air, normal by my interpretation.    COORDINATION OF CARE: 11:28PM-Discussed treatment plan with pt and pt agreed to plan.   Labs Reviewed  DIFFERENTIAL - Abnormal; Notable for the following:    Monocytes Relative 13 (*)    All other components within normal limits  COMPREHENSIVE METABOLIC PANEL - Abnormal; Notable for the following:    Potassium 3.4 (*)    Glucose, Bld 100 (*)    All other components within normal limits  CBC  RAPID STREP SCREEN  GLUCOSE, CAPILLARY   Dg Chest 2 View  06/09/2011  *RADIOLOGY REPORT*  Clinical Data: Cold symptoms.  Seizures.   CHEST - 2 VIEW  Comparison: 08/02/2008  Findings: Mild hyperinflation.  Normal heart size and pulmonary vascularity.  No focal airspace consolidation in the lungs.  No blunting of costophrenic angles.  No pneumothorax.  No significant change since the previous study.  IMPRESSION: No evidence of active pulmonary disease.  Original Report Authenticated By: Marlon Pel, M.D.   Results for orders placed during the hospital encounter of 06/09/11  CBC      Component Value Range   WBC 5.6  4.0 - 10.5 (K/uL)   RBC 4.85  4.22 - 5.81 (MIL/uL)   Hemoglobin 13.4  13.0 - 17.0 (g/dL)   HCT 16.1  09.6 - 04.5 (%)   MCV 82.1  78.0 - 100.0 (fL)   MCH 27.6  26.0 - 34.0 (pg)   MCHC 33.7  30.0 - 36.0 (g/dL)   RDW 40.9  81.1 - 91.4 (%)   Platelets 164  150 - 400 (K/uL)  DIFFERENTIAL      Component Value Range   Neutrophils Relative 62  43 - 77 (%)   Neutro Abs 3.5  1.7 - 7.7 (K/uL)  Lymphocytes Relative 22  12 - 46 (%)   Lymphs Abs 1.2  0.7 - 4.0 (K/uL)   Monocytes Relative 13 (*) 3 - 12 (%)   Monocytes Absolute 0.7  0.1 - 1.0 (K/uL)   Eosinophils Relative 2  0 - 5 (%)   Eosinophils Absolute 0.1  0.0 - 0.7 (K/uL)   Basophils Relative 0  0 - 1 (%)   Basophils Absolute 0.0  0.0 - 0.1 (K/uL)  COMPREHENSIVE METABOLIC PANEL      Component Value Range   Sodium 138  135 - 145 (mEq/L)   Potassium 3.4 (*) 3.5 - 5.1 (mEq/L)   Chloride 101  96 - 112 (mEq/L)   CO2 29  19 - 32 (mEq/L)   Glucose, Bld 100 (*) 70 - 99 (mg/dL)   BUN 8  6 - 23 (mg/dL)   Creatinine, Ser 9.60  0.50 - 1.35 (mg/dL)   Calcium 9.5  8.4 - 45.4 (mg/dL)   Total Protein 7.5  6.0 - 8.3 (g/dL)   Albumin 3.9  3.5 - 5.2 (g/dL)   AST 16  0 - 37 (U/L)   ALT 15  0 - 53 (U/L)   Alkaline Phosphatase 82  39 - 117 (U/L)   Total Bilirubin 0.5  0.3 - 1.2 (mg/dL)   GFR calc non Af Amer >90  >90 (mL/min)   GFR calc Af Amer >90  >90 (mL/min)  RAPID STREP SCREEN      Component Value Range   Streptococcus, Group A Screen (Direct) NEGATIVE   NEGATIVE   GLUCOSE, CAPILLARY      Component Value Range   Glucose-Capillary 85  70 - 99 (mg/dL)      1. Seizure   2. Upper respiratory infection       MDM  Patient with history of seizures last seizure was probably about 2 years ago used to be followed by neurology in [place] patient currently not on medication witnessed seizure tonight sounds like generalized seizure. Patient also with upper respiratory infection workup of chest x-ray no evidence of pneumonia. Patient's not febrile no significant elevation in the white blood cell count strep throat ruled out by rapid strep. Patient stable to go home referral to neurology locally provided.   I personally performed the services described in this documentation, which was scribed in my presence. The recorded information has been reviewed and considered.        Eddie Jakes, MD 06/10/11 [phone]

## 2011-06-09 NOTE — ED Notes (Signed)
Seizure pads placed on bed. Patient resting sitting up in bed. Head of bed 30 degrees. Denies needs. A&O x 4. Call bell within reach. Bed in low position and locked with side rails up.

## 2011-06-09 NOTE — ED Notes (Signed)
Pt with seizure activity at home.  Witnessed by family.  Unknown length

## 2011-06-10 LAB — RAPID STREP SCREEN (MED CTR MEBANE ONLY): Streptococcus, Group A Screen (Direct): NEGATIVE

## 2011-06-10 NOTE — ED Notes (Signed)
Patient A&O x 4. Sitting up in bed. Talking with family. Given graham crackers, peanut butter, and Coke. Tolerating well. Denies needs.

## 2011-06-10 NOTE — Discharge Instructions (Signed)
Return for recurrent seizures new or worse symptoms. For the upper respiratory infection recommend by mouth Motrin if cough gets worse Robitussin-DM and/or TheraFlu. Make appointment to followup with neurology referral information provided above.

## 2011-06-10 NOTE — ED Notes (Signed)
Resting sitting up in bed. A&O x 4. Denies any needs. No seizure activity. Family at bedside. Call bell within reach. Will continue to monitor.

## 2011-11-22 IMAGING — CT CT HEAD W/O CM
1 series · 16 of 30 positions shown, 20 images · non-contrast
Comparison: Head CT 02/17/2005.

CLINICAL DATA: Seizure.  History of seizure disorder.

CT HEAD WITHOUT CONTRAST
TECHNIQUE: Contiguous axial images were obtained from the base of
the skull through the vertex without contrast.

[Series 2: headseq 4.8 h37s · axial · 0.49mm/px · z∈[+129,+264]mm · 16 of 30 slices shown, 20 images]
[im 2/30  brain]
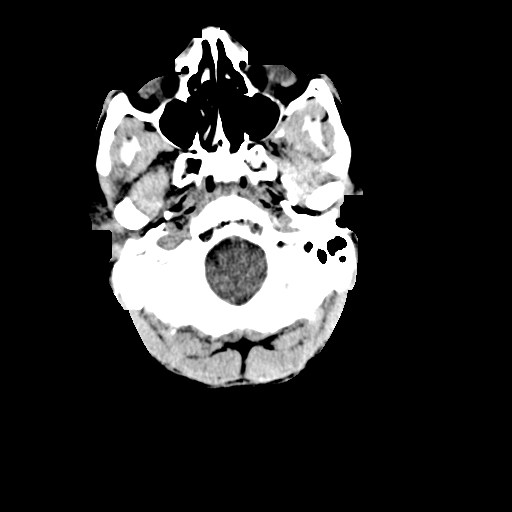
[im 2/30  bone]
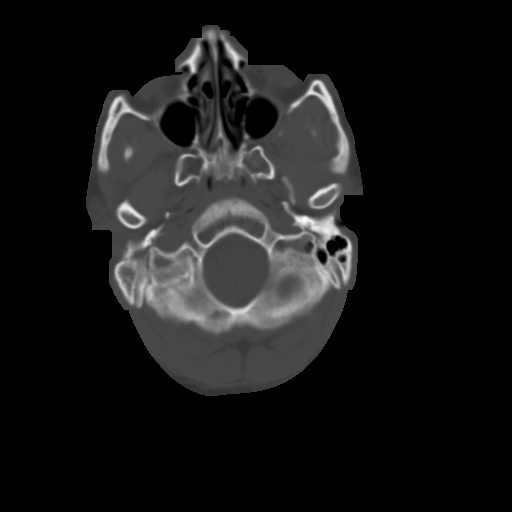
[im 4/30  brain]
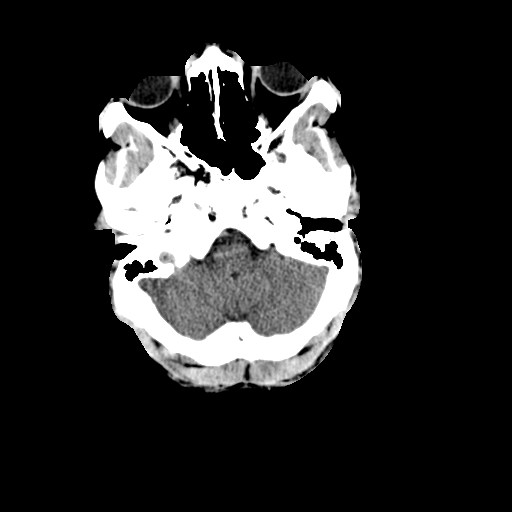
[im 6/30  brain]
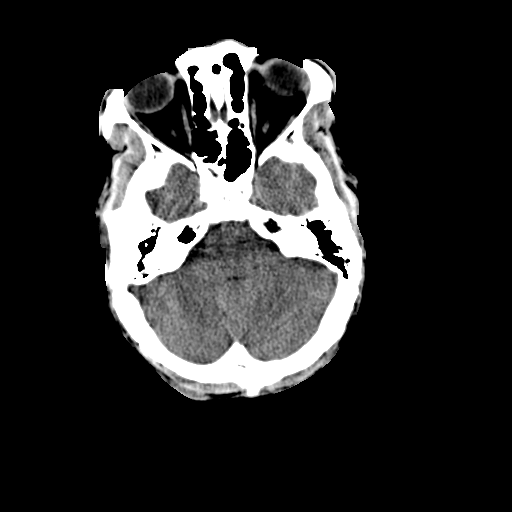
[im 8/30  brain]
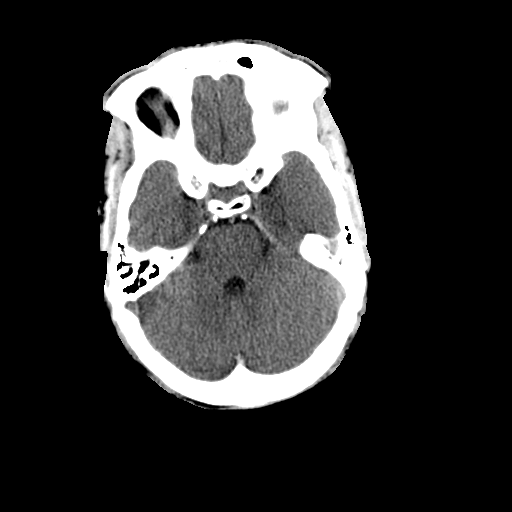
[im 9/30  brain]
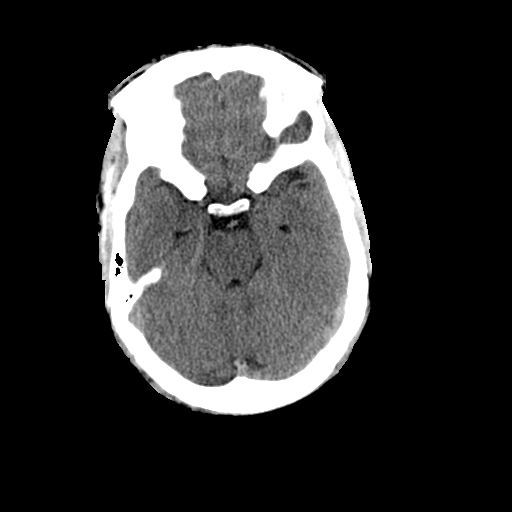
[im 9/30  bone]
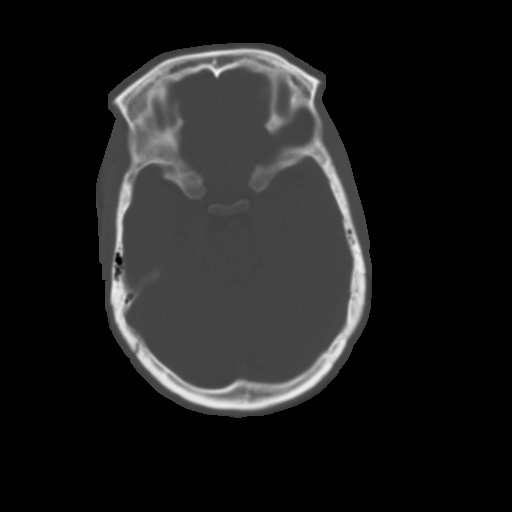
[im 11/30  brain]
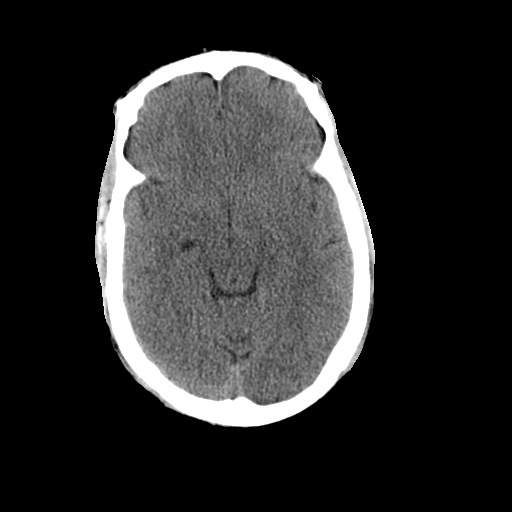
[im 13/30  brain]
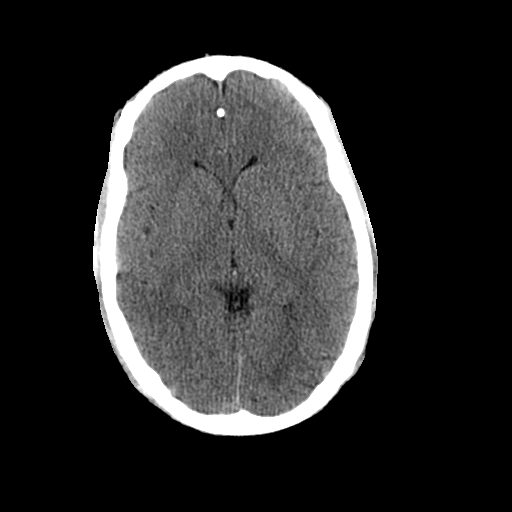
[im 15/30  brain]
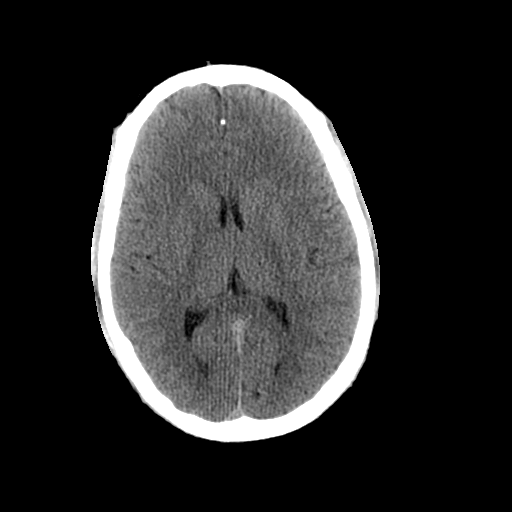
[im 16/30  brain]
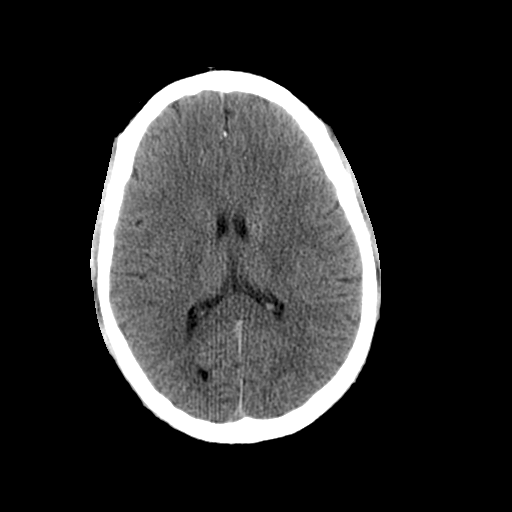
[im 16/30  bone]
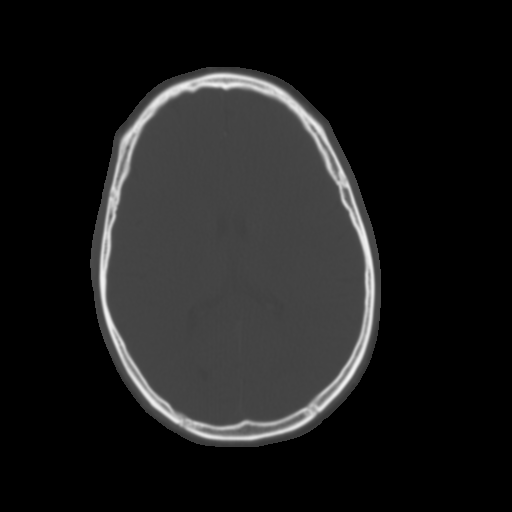
[im 18/30  brain]
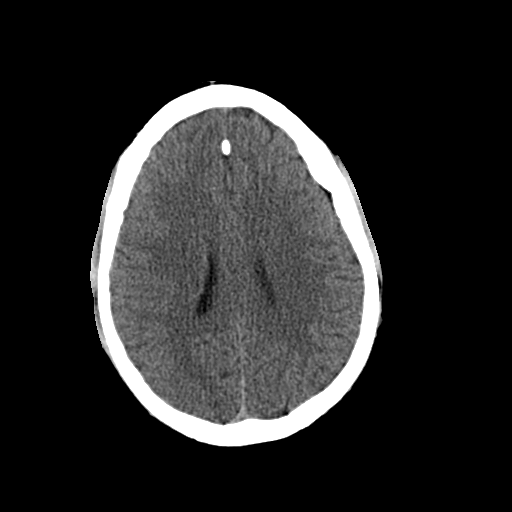
[im 20/30  brain]
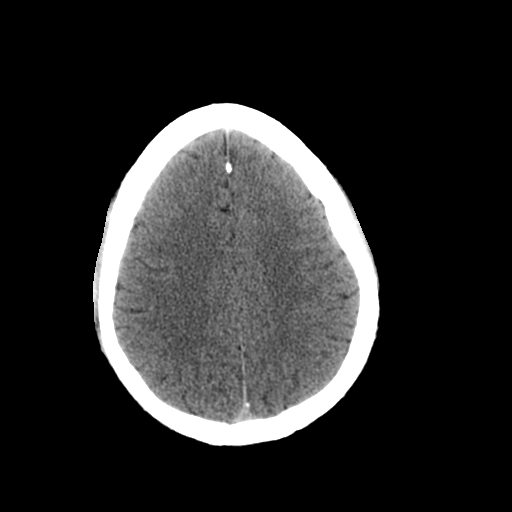
[im 22/30  brain]
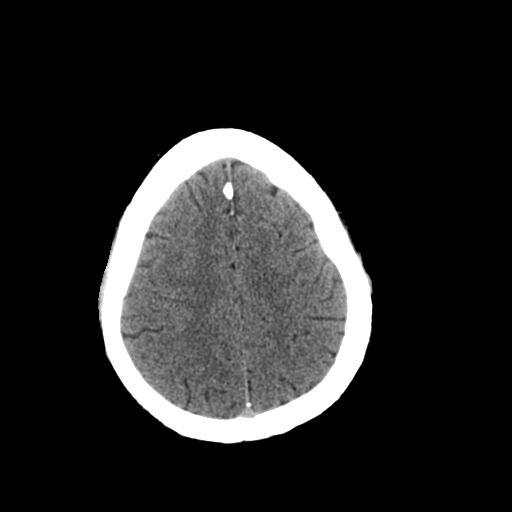
[im 23/30  brain]
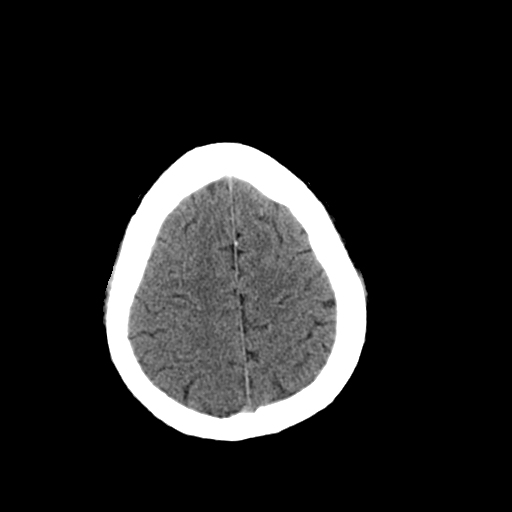
[im 23/30  bone]
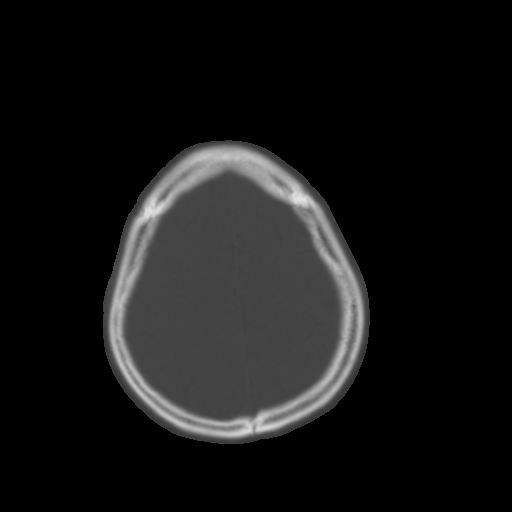
[im 25/30  brain]
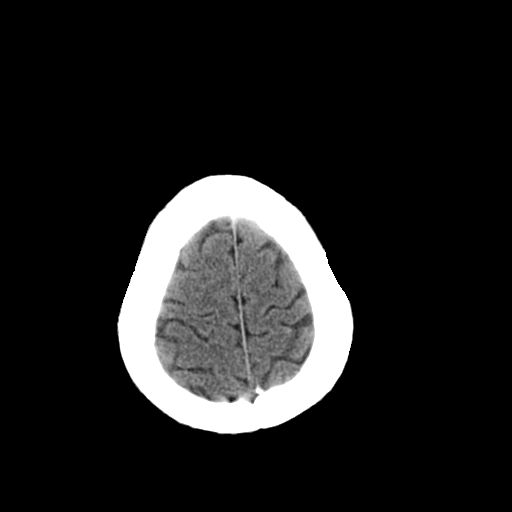
[im 27/30  brain]
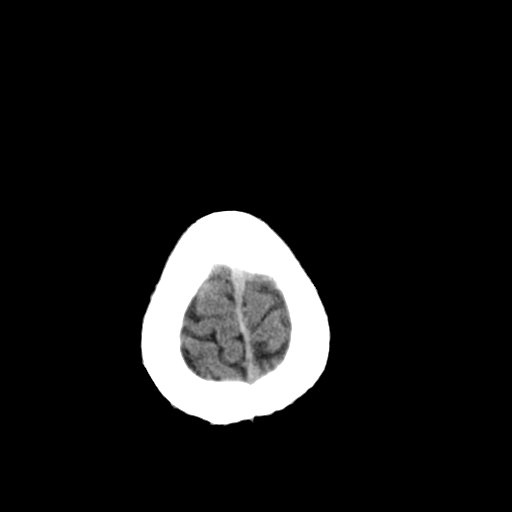
[im 29/30  brain]
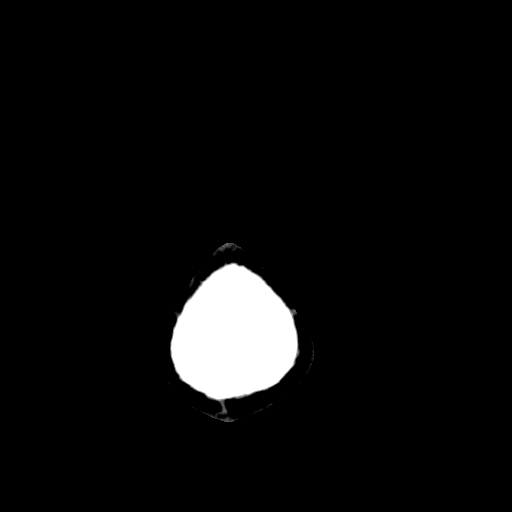

[16 of 30 positions shown; findings below may reference images not displayed]

FINDINGS: There is no evidence of acute intracranial hemorrhage,
mass lesion, brain edema or extra-axial fluid collection.  The
ventricles and subarachnoid spaces are appropriately sized for age.
There is no CT evidence of acute cortical infarction.

The visualized paranasal sinuses are clear. The calvarium is
intact.
IMPRESSION: Unremarkable noncontrast head CT.

## 2013-01-20 ENCOUNTER — Ambulatory Visit (INDEPENDENT_AMBULATORY_CARE_PROVIDER_SITE_OTHER): Payer: Self-pay | Admitting: Nurse Practitioner

## 2013-01-20 ENCOUNTER — Encounter: Payer: Self-pay | Admitting: Nurse Practitioner

## 2013-01-20 ENCOUNTER — Encounter (INDEPENDENT_AMBULATORY_CARE_PROVIDER_SITE_OTHER): Payer: Self-pay

## 2013-01-20 VITALS — BP 114/66 | HR 59 | Ht 70.5 in | Wt 166.0 lb

## 2013-01-20 DIAGNOSIS — G40309 Generalized idiopathic epilepsy and epileptic syndromes, not intractable, without status epilepticus: Secondary | ICD-10-CM | POA: Insufficient documentation

## 2013-01-20 NOTE — Progress Notes (Signed)
[facility]  PATIENT: Eddie [name] DOB: 1990/07/03   REASON FOR VISIT: Followup for seizure disorder   HISTORY OF PRESENT ILLNESS: Mr Mase, 22 year old black male returns for followup. He was last seen in the office 07/22/11. Patient has not had seizures since last seen. No ER visits seen in [facility]. He has not been on any medication for over 2 years. EEG after last visit was normal. No new complaints    HISTORY:  Patient claims his seizures began around 22 years of age, and he has been on medication such as Keppra and Dilantin in the past. Keppra was not effective, and Dilantin resulted in a skin rash. This patient claimed he was on Depakote when last seen but VPA level was 7.  This patient has had a seizure in the last 10 months per Dr. Jorge Ny referral note. Pt  says he does not remember. The patient says he has been off medication for over a year. His seizures in the past , sometimes he would get an aura of a seizure with a warm sensation and a lightheaded sensation lasting 5 minutes or so prior to the onset of the blackout. This patient has generalized tonic-clonic seizures, and may bite his tongue or have urinary incontinence. The pt has injured  the right shoulder in the past with a seizure. This patient does have a family history of seizures, with seizures in the father and possibly in one of his brothers. Patient claims that he has undergone head scanning procedures at [facility] on 05/29/09. . The results of the studies reveal a normal CT of the head. Patient needs DMV forms filled out.    REVIEW OF SYSTEMS: Full 14 system review of systems performed and notable only for:  Constitutional: N/A  Cardiovascular: N/A  Ear/Nose/Throat: N/A  Skin: N/A  Eyes: N/A  Respiratory: N/A  Gastroitestinal: N/A  Hematology/Lymphatic: N/A  Endocrine: N/A Musculoskeletal:N/A  Allergy/Immunology: N/A  Neurological: N/A Psychiatric: N/A   ALLERGIES: Allergies    Allergen Reactions  . Divalproex Sodium Rash    HOME MEDICATIONS: Outpatient Prescriptions Prior to Visit  Medication Sig Dispense Refill  . Pseudoeph-Doxylamine-DM-APAP (DAYQUIL/NYQUIL COLD/FLU RELIEF PO) Take 30 mLs by mouth every 4 (four) hours as needed. For cold symptoms       No facility-administered medications prior to visit.    PAST MEDICAL HISTORY: Past Medical History  Diagnosis Date  . Seizures     PAST SURGICAL HISTORY: History reviewed. No pertinent past surgical history.  FAMILY HISTORY: Father has seizures.  SOCIAL HISTORY: History   Social History  . Marital Status: Married    Spouse Name: N/A    Number of Children: N/A  . Years of Education: 11 grade   Occupational History  . Not on file.   Social History Main Topics  . Smoking status: Never Smoker   . Smokeless tobacco: Never Used  . Alcohol Use: No  . Drug Use: No  . Sexual Activity: Not on file   Other Topics Concern  . Not on file   Social History Narrative  . No narrative on file     PHYSICAL EXAM  Filed Vitals:   01/20/13 1526  BP: 114/66  Pulse: 59  Height: 5' 10.5" (1.791 m)  Weight: 166 lb (75.297 kg)   Body mass index is 23.47 kg/(m^2).  Generalized: Well developed, in no acute distress  Neurological examination   Mentation: Alert oriented to time, place, history taking. Follows all commands  speech and language fluent  Cranial nerve II-XII: Pupils were equal round reactive to light extraocular movements were full, visual field were full on confrontational test. Facial sensation and strength were normal. hearing was intact to finger rubbing bilaterally. Uvula tongue midline. head turning and shoulder shrug and were normal and symmetric.Tongue protrusion into cheek strength was normal. Motor: normal bulk and tone, full strength in the BUE, BLE, fine finger movements normal, no pronator drift. No focal weakness Coordination: finger-nose-finger, heel-to-shin bilaterally,  no dysmetria Reflexes: Brachioradialis 2/2, biceps 2/2, triceps 2/2, patellar 2/2, Achilles 2/2, plantar responses were flexor bilaterally. Gait and Station: Rising up from seated position without assistance, normal stance,  moderate stride, good arm swing, smooth turning, able to perform tiptoe, and heel walking without difficulty.   DIAGNOSTIC DATA (LABS, IMAGING, TESTING) -None to revieASSESSMENT AND PLAN  22 y.o. year old male  has a past medical history of Seizures. here to followup. No seizure since last seen, patient has not been on medication for 2 years. EEG last year normal  Will fill out DMV form.   Nilda Riggs, [facility], [facility], APRN  [facility] [address], [address] [place], [place] [address] [phone]

## 2013-01-20 NOTE — Progress Notes (Signed)
I have read the note, and I agree with the clinical assessment and plan.  Eddie [name],Eddie [name]   

## 2013-01-20 NOTE — Patient Instructions (Signed)
Will fill out DMV forms.  F/U yearly

## 2013-01-26 DIAGNOSIS — Z0289 Encounter for other administrative examinations: Secondary | ICD-10-CM

## 2013-02-07 ENCOUNTER — Telehealth: Payer: Self-pay

## 2013-02-07 NOTE — Telephone Encounter (Signed)
I called and left a VM for patient that DMV forms have been faxed. I will mail him the originals along with fax confirmation sheet for his records to his address on file.

## 2013-08-18 ENCOUNTER — Encounter (HOSPITAL_COMMUNITY): Payer: Self-pay

## 2013-08-18 ENCOUNTER — Encounter (HOSPITAL_COMMUNITY)
Admission: RE | Admit: 2013-08-18 | Discharge: 2013-08-18 | Disposition: A | Discharge status: Home / Self Care | Payer: Medicaid Other | Source: Ambulatory Visit | Attending: General Surgery | Admitting: General Surgery

## 2013-08-18 LAB — BASIC METABOLIC PANEL
BUN: 10 mg/dL (ref 6–23)
CALCIUM: 9.5 mg/dL (ref 8.4–10.5)
CO2: 27 mEq/L (ref 19–32)
CREATININE: 1.04 mg/dL (ref 0.50–1.35)
Chloride: 101 mEq/L (ref 96–112)
Glucose, Bld: 98 mg/dL (ref 70–99)
Potassium: 3.9 mEq/L (ref 3.7–5.3)
SODIUM: 141 meq/L (ref 137–147)

## 2013-08-18 LAB — CBC WITH DIFFERENTIAL/PLATELET
BASOS ABS: 0 10*3/uL (ref 0.0–0.1)
BASOS PCT: 0 % (ref 0–1)
EOS PCT: 4 % (ref 0–5)
Eosinophils Absolute: 0.2 10*3/uL (ref 0.0–0.7)
HEMATOCRIT: 40.4 % (ref 39.0–52.0)
Hemoglobin: 13.9 g/dL (ref 13.0–17.0)
Lymphocytes Relative: 28 % (ref 12–46)
Lymphs Abs: 1.3 10*3/uL (ref 0.7–4.0)
MCH: 28.5 pg (ref 26.0–34.0)
MCHC: 34.4 g/dL (ref 30.0–36.0)
MCV: 82.8 fL (ref 78.0–100.0)
MONO ABS: 0.4 10*3/uL (ref 0.1–1.0)
Monocytes Relative: 9 % (ref 3–12)
Neutro Abs: 2.7 10*3/uL (ref 1.7–7.7)
Neutrophils Relative %: 59 % (ref 43–77)
PLATELETS: 193 10*3/uL (ref 150–400)
RBC: 4.88 MIL/uL (ref 4.22–5.81)
RDW: 13.2 % (ref 11.5–15.5)
WBC: 4.5 10*3/uL (ref 4.0–10.5)

## 2013-08-18 NOTE — Patient Instructions (Signed)
Eddie MangesJonathan L [name]  08/18/2013   Your procedure is scheduled on:  08/21/13  [facility].  Call this number if you have problems the morning of surgery: [phone]   Remember:   Do not eat food or drink liquids after midnight.   Take these medicines the morning of surgery with A SIP OF WATER: None   Do not wear jewelry, make-up or nail polish.  Do not wear lotions, powders, or perfumes. You may wear deodorant.  Do not shave 48 hours prior to surgery. Men may shave face and neck.  Do not bring valuables to the hospital.  [facility] belongings or valuables.               Contacts, dentures or bridgework may not be worn into surgery.  Leave suitcase in the car. After surgery it may be brought to your room.  For patients admitted to the hospital, discharge time is determined by your treatment team.               Patients discharged the day of surgery will not be allowed to drive home.   Special Instructions: Shower using CHG 1 night before surgery and the morning of surgery.    Please read over the following fact sheets that you were given: Anesthesia Post-op Instructions   Inguinal Hernia, Adult Muscles help keep everything in the body in its proper place. But if a weak spot in the muscles develops, something can poke through. That is called a hernia. When this happens in the lower part of the belly (abdomen), it is called an inguinal hernia. (It takes its name from a part of the body in this region called the inguinal canal.) A weak spot in the wall of muscles lets some fat or part of the small intestine bulge through. An inguinal hernia can develop at any age. Men get them more often than women. CAUSES  In adults, an inguinal hernia develops over time.  It can be triggered by:  Suddenly straining the muscles of the lower abdomen.  Lifting heavy objects.  Straining to have a bowel movement. Difficult bowel movements (constipation)  can lead to this.  Constant coughing. This may be caused by smoking or lung disease.  Being overweight.  Being pregnant.  Working at a job that requires long periods of standing or heavy lifting.  Having had an inguinal hernia before. One type can be an emergency situation. It is called a strangulated inguinal hernia. It develops if part of the small intestine slips through the weak spot and cannot get back into the abdomen. The blood supply can be cut off. If that happens, part of the intestine may die. This situation requires emergency surgery. SYMPTOMS  Often, a small inguinal hernia has no symptoms. It is found when a healthcare provider does a physical exam. Larger hernias usually have symptoms.   In adults, symptoms may include:  A lump in the groin. This is easier to see when the person is standing. It might disappear when lying down.  In men, a lump in the scrotum.  Pain or burning in the groin. This occurs especially when lifting, straining or coughing.  A dull ache or feeling of pressure in the groin.  Signs of a strangulated hernia can include:  A bulge in the groin that becomes very painful and tender to the touch.  A bulge that turns red or purple.  Fever, nausea and vomiting.  Inability to have a bowel movement or to pass gas. DIAGNOSIS  To decide if you have an inguinal hernia, a healthcare provider will probably do a physical examination.  This will include asking questions about any symptoms you have noticed.  The healthcare provider might feel the groin area and ask you to cough. If an inguinal hernia is felt, the healthcare provider may try to slide it back into the abdomen.  Usually no other tests are needed. TREATMENT  Treatments can vary. The size of the hernia makes a difference. Options include:  Watchful waiting. This is often suggested if the hernia is small and you have had no symptoms.  No medical procedure will be done unless symptoms  develop.  You will need to watch closely for symptoms. If any occur, contact your healthcare provider right away.  Surgery. This is used if the hernia is larger or you have symptoms.  Open surgery. This is usually an outpatient procedure (you will not stay overnight in a hospital). An cut (incision) is made through the skin in the groin. The hernia is put back inside the abdomen. The weak area in the muscles is then repaired by herniorrhaphy or hernioplasty. Herniorrhaphy: in this type of surgery, the weak muscles are sewn back together. Hernioplasty: a patch or mesh is used to close the weak area in the abdominal wall.  Laparoscopy. In this procedure, a surgeon makes small incisions. A thin tube with a tiny video camera (called a laparoscope) is put into the abdomen. The surgeon repairs the hernia with mesh by looking with the video camera and using two long instruments. HOME CARE INSTRUCTIONS   After surgery to repair an inguinal hernia:  You will need to take pain medicine prescribed by your healthcare provider. Follow all directions carefully.  You will need to take care of the wound from the incision.  Your activity will be restricted for awhile. This will probably include no heavy lifting for several weeks. You also should not do anything too active for a few weeks. When you can return to work will depend on the type of job that you have.  During "watchful waiting" periods, you should:  Maintain a healthy weight.  Eat a diet high in fiber (fruits, vegetables and whole grains).  Drink plenty of fluids to avoid constipation. This means drinking enough water and other liquids to keep your urine clear or pale yellow.  Do not lift heavy objects.  Do not stand for long periods of time.  Quit smoking. This should keep you from developing a frequent cough. SEEK MEDICAL CARE IF:   A bulge develops in your groin area.  You feel pain, a burning sensation or pressure in the groin. This  might be worse if you are lifting or straining.  You develop a fever of more than 100.5 F (38.1 C). SEEK IMMEDIATE MEDICAL CARE IF:   Pain in the groin increases suddenly.  A bulge in the groin gets bigger suddenly and does not go down.  For men, there is sudden pain in the scrotum. Or, the size of the scrotum increases.  A bulge in the groin area becomes red or purple and is painful to touch.  You have nausea or vomiting that does not go away.  You feel your heart beating much faster than normal.  You cannot have a bowel movement or pass gas.  You develop a fever of more than 102.0 F (38.9 C). Document Released: 07/12/2008 Document Revised: 05/18/2011 Document Reviewed:  07/12/2008 ExitCare Patient Information 2014 [employer]  Inguinal Hernia, Adult  Care After Refer to this sheet in the next few weeks. These discharge instructions provide you with general information on caring for yourself after you leave the hospital. Your caregiver may also give you specific instructions. Your treatment has been planned according to the most current medical practices available, but unavoidable complications sometimes occur. If you have any problems or questions after discharge, please call your caregiver. HOME CARE INSTRUCTIONS  Put ice on the operative site.  Put ice in a plastic bag.  Place a towel between your skin and the bag.  Leave the ice on for 15-20 minutes at a time, 03-04 times a day while awake.  Change bandages (dressings) as directed.  Keep the wound dry and clean. The wound may be washed gently with soap and water. Gently blot or dab the wound dry. It is okay to take showers 24 to 48 hours after surgery. Do not take baths, use swimming pools, or use hot tubs for 10 days, or as directed by your caregiver.  Only take over-the-counter or prescription medicines for pain, discomfort, or fever as directed by your caregiver.  Continue your normal diet as directed.  Do  not lift anything more than 10 pounds or play contact sports for 3 weeks, or as directed. SEEK MEDICAL CARE IF:  There is redness, swelling, or increasing pain in the wound.  There is fluid (pus) coming from the wound.  There is drainage from a wound lasting longer than 1 day.  You have an oral temperature above 102 F (38.9 C).  You notice a bad smell coming from the wound or dressing.  The wound breaks open after the stitches (sutures) have been removed.  You notice increasing pain in the shoulders (shoulder strap areas).  You develop dizzy episodes or fainting while standing.  You feel sick to your stomach (nauseous) or throw up (vomit). SEEK IMMEDIATE MEDICAL CARE IF:  You develop a rash.  You have difficulty breathing.  You develop a reaction or have side effects to medicines you were given. MAKE SURE YOU:   Understand these instructions.  Will watch your condition.  Will get help right away if you are not doing well or get worse. Document Released: 03/26/2006 Document Revised: 05/18/2011 Document Reviewed: 01/23/2009 [facility] Patient Information 2014 [employer] [place].    PATIENT INSTRUCTIONS POST-ANESTHESIA  IMMEDIATELY FOLLOWING SURGERY:  Do not drive or operate machinery for the first twenty four hours after surgery.  Do not make any important decisions for twenty four hours after surgery or while taking narcotic pain medications or sedatives.  If you develop intractable nausea and vomiting or a severe headache please notify your doctor immediately.  FOLLOW-UP:  Please make an appointment with your surgeon as instructed. You do not need to follow up with anesthesia unless specifically instructed to do so.  WOUND CARE INSTRUCTIONS (if applicable):  Keep a dry clean dressing on the anesthesia/puncture wound site if there is drainage.  Once the wound has quit draining you may leave it open to air.  Generally you should leave the bandage intact for twenty four hours  unless there is drainage.  If the epidural site drains for more than 36-48 hours please call the anesthesia department.  QUESTIONS?:  Please feel free to call your physician or the hospital operator if you have any questions, and they will be happy to assist you.

## 2013-08-21 ENCOUNTER — Encounter (HOSPITAL_COMMUNITY)
Admission: RE | Disposition: A | Discharge status: Home / Self Care | Payer: Self-pay | Source: Ambulatory Visit | Attending: General Surgery

## 2013-08-21 ENCOUNTER — Ambulatory Visit (HOSPITAL_COMMUNITY): Payer: Medicaid Other | Admitting: Anesthesiology

## 2013-08-21 ENCOUNTER — Encounter (HOSPITAL_COMMUNITY): Payer: Self-pay

## 2013-08-21 ENCOUNTER — Encounter (HOSPITAL_COMMUNITY): Payer: Medicaid Other | Admitting: Anesthesiology

## 2013-08-21 ENCOUNTER — Ambulatory Visit (HOSPITAL_COMMUNITY)
Admission: RE | Admit: 2013-08-21 | Discharge: 2013-08-22 | Disposition: A | Discharge status: Home / Self Care | Payer: Medicaid Other | Source: Ambulatory Visit | Attending: General Surgery | Admitting: General Surgery

## 2013-08-21 DIAGNOSIS — K4030 Unilateral inguinal hernia, with obstruction, without gangrene, not specified as recurrent: Secondary | ICD-10-CM | POA: Insufficient documentation

## 2013-08-21 DIAGNOSIS — G40309 Generalized idiopathic epilepsy and epileptic syndromes, not intractable, without status epilepticus: Secondary | ICD-10-CM

## 2013-08-21 DIAGNOSIS — G40909 Epilepsy, unspecified, not intractable, without status epilepticus: Secondary | ICD-10-CM | POA: Insufficient documentation

## 2013-08-21 HISTORY — PX: INGUINAL HERNIA REPAIR: SHX194

## 2013-08-21 SURGERY — REPAIR, HERNIA, INGUINAL, ADULT
Anesthesia: General | Site: Groin | Laterality: Left

## 2013-08-21 MED ORDER — POTASSIUM CHLORIDE IN NACL 20-0.9 MEQ/L-% IV SOLN
INTRAVENOUS | Status: DC
  Administered 2013-08-21 – 2013-08-22 (×2): via INTRAVENOUS

## 2013-08-21 MED ORDER — FENTANYL CITRATE 0.05 MG/ML IJ SOLN
INTRAMUSCULAR | Status: AC
  Filled 2013-08-21: qty 2

## 2013-08-21 MED ORDER — SODIUM CHLORIDE 0.9 % IR SOLN
Status: DC | PRN
  Administered 2013-08-21: 1000 mL

## 2013-08-21 MED ORDER — SODIUM CHLORIDE 0.9 % IJ SOLN
INTRAMUSCULAR | Status: AC
  Filled 2013-08-21: qty 10

## 2013-08-21 MED ORDER — BUPIVACAINE HCL (PF) 0.5 % IJ SOLN
INTRAMUSCULAR | Status: AC
  Filled 2013-08-21: qty 30

## 2013-08-21 MED ORDER — CEFAZOLIN SODIUM 1-5 GM-% IV SOLN
INTRAVENOUS | Status: AC
  Filled 2013-08-21: qty 50

## 2013-08-21 MED ORDER — ONDANSETRON HCL 4 MG/2ML IJ SOLN
4.0000 mg | Freq: Once | INTRAMUSCULAR | Status: DC | PRN
Start: 2013-08-21 — End: 2013-08-21

## 2013-08-21 MED ORDER — MIDAZOLAM HCL 2 MG/2ML IJ SOLN
1.0000 mg | INTRAMUSCULAR | Status: DC | PRN
  Administered 2013-08-21: 2 mg via INTRAVENOUS

## 2013-08-21 MED ORDER — BUPIVACAINE IN DEXTROSE 0.75-8.25 % IT SOLN
INTRATHECAL | Status: AC
  Filled 2013-08-21: qty 2

## 2013-08-21 MED ORDER — CEFAZOLIN SODIUM 1 G IJ SOLR
INTRAMUSCULAR | Status: AC
  Filled 2013-08-21: qty 10

## 2013-08-21 MED ORDER — ONDANSETRON HCL 4 MG/2ML IJ SOLN
4.0000 mg | Freq: Once | INTRAMUSCULAR | Status: AC
  Administered 2013-08-21: 4 mg via INTRAVENOUS

## 2013-08-21 MED ORDER — GLYCOPYRROLATE 0.2 MG/ML IJ SOLN
INTRAMUSCULAR | Status: AC
  Filled 2013-08-21: qty 1

## 2013-08-21 MED ORDER — ONDANSETRON HCL 4 MG PO TABS: 4.0000 mg | ORAL_TABLET | Freq: Four times a day (QID) | ORAL | Status: DC | PRN

## 2013-08-21 MED ORDER — MIDAZOLAM HCL 2 MG/2ML IJ SOLN
INTRAMUSCULAR | Status: AC
  Filled 2013-08-21: qty 2

## 2013-08-21 MED ORDER — CEFAZOLIN SODIUM-DEXTROSE 2-3 GM-% IV SOLR
2.0000 g | Freq: Once | INTRAVENOUS | Status: AC
  Administered 2013-08-21: 2 g via INTRAVENOUS

## 2013-08-21 MED ORDER — ACETAMINOPHEN 325 MG PO TABS
650.0000 mg | ORAL_TABLET | ORAL | Status: DC | PRN
  Administered 2013-08-21: 650 mg via ORAL
  Filled 2013-08-21: qty 2

## 2013-08-21 MED ORDER — SEVOFLURANE IN SOLN
RESPIRATORY_TRACT | Status: AC
  Filled 2013-08-21: qty 250

## 2013-08-21 MED ORDER — BUPIVACAINE HCL (PF) 0.5 % IJ SOLN
INTRAMUSCULAR | Status: DC | PRN
  Administered 2013-08-21: 10 mL

## 2013-08-21 MED ORDER — PROPOFOL 10 MG/ML IV BOLUS
INTRAVENOUS | Status: DC | PRN
  Administered 2013-08-21: 120 mg via INTRAVENOUS
  Administered 2013-08-21: 200 mg via INTRAVENOUS
  Administered 2013-08-21: 30 mg via INTRAVENOUS
  Administered 2013-08-21: 20 mg via INTRAVENOUS

## 2013-08-21 MED ORDER — PROPOFOL 10 MG/ML IV BOLUS
INTRAVENOUS | Status: AC
  Filled 2013-08-21: qty 20

## 2013-08-21 MED ORDER — FENTANYL CITRATE 0.05 MG/ML IJ SOLN
25.0000 ug | INTRAMUSCULAR | Status: DC | PRN
  Administered 2013-08-21 (×2): 50 ug via INTRAVENOUS

## 2013-08-21 MED ORDER — DOCUSATE SODIUM 100 MG PO CAPS
100.0000 mg | ORAL_CAPSULE | Freq: Two times a day (BID) | ORAL | Status: DC
  Administered 2013-08-21 – 2013-08-22 (×2): 100 mg via ORAL
  Filled 2013-08-21 (×2): qty 1

## 2013-08-21 MED ORDER — BACITRACIN ZINC 500 UNIT/GM EX OINT
TOPICAL_OINTMENT | CUTANEOUS | Status: AC
  Filled 2013-08-21: qty 0.9

## 2013-08-21 MED ORDER — GLYCOPYRROLATE 0.2 MG/ML IJ SOLN: 0.2000 mg | Freq: Once | INTRAMUSCULAR | Status: DC

## 2013-08-21 MED ORDER — GLYCOPYRROLATE 0.2 MG/ML IJ SOLN
INTRAMUSCULAR | Status: DC | PRN
  Administered 2013-08-21: 0.2 mg via INTRAVENOUS

## 2013-08-21 MED ORDER — FENTANYL CITRATE 0.05 MG/ML IJ SOLN
25.0000 ug | INTRAMUSCULAR | Status: DC
  Administered 2013-08-21: 25 ug via INTRAVENOUS

## 2013-08-21 MED ORDER — LIDOCAINE HCL (CARDIAC) 10 MG/ML IV SOLN
INTRAVENOUS | Status: DC | PRN
  Administered 2013-08-21: 10 mg via INTRAVENOUS

## 2013-08-21 MED ORDER — MORPHINE SULFATE 4 MG/ML IJ SOLN
1.0000 mg | INTRAMUSCULAR | Status: DC | PRN
  Administered 2013-08-21 – 2013-08-22 (×6): 1 mg via INTRAVENOUS
  Filled 2013-08-21 (×6): qty 1

## 2013-08-21 MED ORDER — LIDOCAINE HCL (PF) 1 % IJ SOLN
INTRAMUSCULAR | Status: AC
  Filled 2013-08-21: qty 5

## 2013-08-21 MED ORDER — LACTATED RINGERS IV SOLN
INTRAVENOUS | Status: DC
  Administered 2013-08-21 (×2): via INTRAVENOUS

## 2013-08-21 MED ORDER — ONDANSETRON HCL 4 MG/2ML IJ SOLN
INTRAMUSCULAR | Status: AC
  Filled 2013-08-21: qty 2

## 2013-08-21 MED ORDER — ONDANSETRON HCL 4 MG/2ML IJ SOLN: 4.0000 mg | Freq: Four times a day (QID) | INTRAMUSCULAR | Status: DC | PRN

## 2013-08-21 MED ORDER — FENTANYL CITRATE 0.05 MG/ML IJ SOLN
INTRAMUSCULAR | Status: DC | PRN
  Administered 2013-08-21: 25 ug via INTRAVENOUS
  Administered 2013-08-21: 50 ug via INTRAVENOUS
  Administered 2013-08-21 (×6): 25 ug via INTRAVENOUS

## 2013-08-21 SURGICAL SUPPLY — 47 items
ATTRACTOMAT 16X20 MAGNETIC DRP (DRAPES) ×3 IMPLANT
BAG HAMPER (MISCELLANEOUS) ×3 IMPLANT
BLADE 10 SAFETY STRL DISP (BLADE) ×3 IMPLANT
CLOTH BEACON ORANGE TIMEOUT ST (SAFETY) ×3 IMPLANT
COVER LIGHT HANDLE STERIS (MISCELLANEOUS) ×6 IMPLANT
DECANTER SPIKE VIAL GLASS SM (MISCELLANEOUS) ×3 IMPLANT
DRAIN PENROSE 12X.25 LTX STRL (MISCELLANEOUS) ×3 IMPLANT
DRSG MEPILEX BORDER 4X8 (GAUZE/BANDAGES/DRESSINGS) IMPLANT
ELECT REM PT RETURN 9FT ADLT (ELECTROSURGICAL) ×3
ELECTRODE REM PT RTRN 9FT ADLT (ELECTROSURGICAL) ×1 IMPLANT
FORMALIN 10 PREFIL 120ML (MISCELLANEOUS) ×3 IMPLANT
GAUZE SPONGE 4X4 12PLY STRL (GAUZE/BANDAGES/DRESSINGS) ×1 IMPLANT
GLOVE BIOGEL PI IND STRL 7.0 (GLOVE) IMPLANT
GLOVE BIOGEL PI IND STRL 7.5 (GLOVE) IMPLANT
GLOVE BIOGEL PI INDICATOR 7.0 (GLOVE) ×4
GLOVE BIOGEL PI INDICATOR 7.5 (GLOVE) ×2
GLOVE SKINSENSE NS SZ7.0 (GLOVE) ×6
GLOVE SKINSENSE STRL SZ7.0 (GLOVE) ×1 IMPLANT
GLOVE SS BIOGEL STRL SZ 6.5 (GLOVE) IMPLANT
GLOVE SUPERSENSE BIOGEL SZ 6.5 (GLOVE) ×2
GOWN STRL REUS W/TWL LRG LVL3 (GOWN DISPOSABLE) ×9 IMPLANT
INST SET MINOR GENERAL (KITS) ×3 IMPLANT
KIT ROOM TURNOVER APOR (KITS) ×3 IMPLANT
MANIFOLD NEPTUNE II (INSTRUMENTS) ×3 IMPLANT
NS IRRIG 1000ML POUR BTL (IV SOLUTION) ×3 IMPLANT
PACK MINOR (CUSTOM PROCEDURE TRAY) ×3 IMPLANT
PAD ARMBOARD 7.5X6 YLW CONV (MISCELLANEOUS) ×3 IMPLANT
SET BASIN LINEN APH (SET/KITS/TRAYS/PACK) ×3 IMPLANT
SOL PREP PROV IODINE SCRUB 4OZ (MISCELLANEOUS) ×3 IMPLANT
SPONGE GAUZE 4X4 12PLY (GAUZE/BANDAGES/DRESSINGS) ×2 IMPLANT
SPONGE INTESTINAL PEANUT (DISPOSABLE) ×3 IMPLANT
SPONGE LAP 18X18 X RAY DECT (DISPOSABLE) ×3 IMPLANT
STAPLER VISISTAT 35W (STAPLE) ×1 IMPLANT
SUT NOVA NAB GS-22 2 2-0 T-19 (SUTURE) IMPLANT
SUT NUROLON NAB CT 2 2-0 18IN (SUTURE) ×4 IMPLANT
SUT PROLENE 0 CT 1 CR/8 (SUTURE) IMPLANT
SUT SILK 2 0 (SUTURE) ×3
SUT SILK 2-0 18XBRD TIE 12 (SUTURE) ×1 IMPLANT
SUT VIC AB 3-0 SH 27 (SUTURE) ×3
SUT VIC AB 3-0 SH 27X BRD (SUTURE) ×1 IMPLANT
SUT VICRYL AB 3 0 TIES (SUTURE) ×3 IMPLANT
SYR BULB IRRIGATION 50ML (SYRINGE) ×3 IMPLANT
SYR CONTROL 10ML LL (SYRINGE) ×3 IMPLANT
TAPE PAPER 2X10 WHT MICROPORE (GAUZE/BANDAGES/DRESSINGS) ×2 IMPLANT
TRAY FOLEY CATH 16FR SILVER (SET/KITS/TRAYS/PACK) ×3 IMPLANT
WATER STERILE IRR 1000ML POUR (IV SOLUTION) ×6 IMPLANT
YANKAUER SUCT BULB TIP NO VENT (SUCTIONS) ×2 IMPLANT

## 2013-08-21 NOTE — Brief Op Note (Signed)
08/21/2013  9:18 AM  PATIENT:  Eddie MangesJonathan L Furnas  23 y.o. male  PRE-OPERATIVE DIAGNOSIS:  left inguinal hernia  POST-OPERATIVE DIAGNOSIS:  incareated left inguinal hernia  PROCEDURE:  Procedure(s): LEFT HERNIA REPAIR INGUINAL ADULT (Left)  SURGEON:  Surgeon(s) and Role:    * Marlane HatcherWilliam S Pansie Guggisberg, MD - Primary  PHYSICIAN ASSISTANT:   ASSISTANTS: none   ANESTHESIA:   general  EBL:  Total I/O In: 1100 [I.V.:1100] Out: 170 [Urine:150; Blood:20]  BLOOD ADMINISTERED:none  DRAINS: none   LOCAL MEDICATIONS USED:  MARCAINE   0.5%  ~10 cc.  SPECIMEN:  Source of Specimen:  LIH sac with incarcerated omentum.  DISPOSITION OF SPECIMEN:  PATHOLOGY  COUNTS:  YES  TOURNIQUET:  * No tourniquets in log *  DICTATION: .Other Dictation: Dictation Number OR dictation # [id number].  PLAN OF CARE: Admit for overnight observation  PATIENT DISPOSITION:  PACU - hemodynamically stable.   Delay start of Pharmacological VTE agent (>24hrs) due to surgical blood loss or risk of bleeding: not applicable

## 2013-08-21 NOTE — Progress Notes (Signed)
Patient ambulated in hallway. Slow steady gait. Tolerated well. 

## 2013-08-21 NOTE — Progress Notes (Signed)
Patient arrived to floor. Drowsy but arousable. Vital signs stable. Family at bedside.

## 2013-08-21 NOTE — Progress Notes (Signed)
[age] yr. old African American for elective repair of LIH.  Procedure and risks addressed and informed consent obtained.  Labs reviewed and surgical site marked.  No clinical changes since H&P, however, he forgot to tell me that he had a hx of seizures, the last one being in 2013.  He is being followed by Dr.Hill for this and is on no present meds for this.  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  H&P # [id number].  Filed Vitals:   08/21/13 0655  BP: 115/64  Pulse:   Temp:   Resp: 19  pulse 55, temp 97.8, O2 sat 97%

## 2013-08-21 NOTE — Anesthesia Preprocedure Evaluation (Signed)
Anesthesia Evaluation  Patient identified by MRN, date of birth, ID band Patient awake    Reviewed: Allergy & Precautions, H&P , NPO status , Patient's Chart, lab work & pertinent test results  Airway Mallampati: II TM Distance: >3 FB Neck ROM: Full    Dental  (+) Teeth Intact   Pulmonary neg pulmonary ROS,  breath sounds clear to auscultation        Cardiovascular negative cardio ROS  Rhythm:Regular Rate:Normal     Neuro/Psych Well Controlled,     GI/Hepatic negative GI ROS,   Endo/Other    Renal/GU      Musculoskeletal   Abdominal   Peds  Hematology   Anesthesia Other Findings   Reproductive/Obstetrics                           Anesthesia Physical Anesthesia Plan  ASA: II  Anesthesia Plan: General   Post-op Pain Management:    Induction: Intravenous  Airway Management Planned: LMA  Additional Equipment:   Intra-op Plan:   Post-operative Plan: Extubation in OR  Informed Consent: I have reviewed the patients History and Physical, chart, labs and discussed the procedure including the risks, benefits and alternatives for the proposed anesthesia with the patient or authorized representative who has indicated his/her understanding and acceptance.     Plan Discussed with:   Anesthesia Plan Comments:         Anesthesia Quick Evaluation

## 2013-08-21 NOTE — Progress Notes (Signed)
Post OP Check  Filed Vitals:   08/21/13 2202  BP: 140/60  Pulse: 80  Temp: 99.9 F (37.7 C)  Resp: 18    Awake and alert.  Dressings dry and in tact.  Incisional pain expected.  Some dysuria after catheterization.  Lungs clear. Doing well post op.  Will watch temp. And check cbc in AM.

## 2013-08-21 NOTE — Op Note (Deleted)
NAME:  Compere, Triston           ACCOUNT NO.:  [id number]  MEDICAL RECORD NO.:  [id number]  LOCATION:  DOIB                          FACILITY:  [facility]  PHYSICIAN:  Philis Doke, M.D. DATE OF BIRTH:  07/05/1990  DATE OF PROCEDURE:  08/21/2013 DATE OF DISCHARGE:  08/22/2013                              OPERATIVE REPORT   PREOPERATIVE DIAGNOSIS:  Left inguinal hernia.  POSTOPERATIVE DIAGNOSIS:  Left inguinal hernia.  GROSS OPERATIVE FINDINGS:  The patient had an incarcerated left inguinal hernia with incarcerated omentum.  There was no infarction of tissue.  WOUND CLASSIFICATION:  Clean.  PROCEDURE:  Left inguinal herniorrhaphy (no mesh required, modified McVay repair).  NOTE:  This is a 23-year-old African American who presented to Dr. Hill's Service with a left inguinal hernia.  He was referred to me for elective repair.  We did this via the outpatient department.  Prior to surgery, we discussed complications not limited to but including bleeding, infection, and recurrence and informed consent was obtained.  TECHNIQUE:  The patient was placed in the supine position.  After the adequate administration of LMA anesthesia, he was prepped with Betadine solution and draped in usual manner.  A Foley catheter was aseptically inserted.  An incision was carried out between the anterior-superior iliac spine in the pubic tubercle.  Skin, subcutaneous tissue and Scarpa's layer was incised down to the hernia sac, this was a large hernia sac as the patient had incarcerated omentum within this.  We dissected the sac free from the cord structures.  Then, under direct vision, I opened the sac, released the omentum, and a portion of it was removed within the sac where it was incarcerated.  The omentum was ligated with 2-0 silk and returned to the intra-abdominal cavity.  We then ligated the hernia sac with 2-0 Bralon doubly ligating this and then repairing the direct portion of the hernia  sac with 2-0 Bralon sutures in the interrupted fashion suturing the transversus abdominis and transversalis fascia to Cooper ligament and Poupart ligament with interrupted sutures.  Prior to cinching these sutures tightly, the relaxing incision was carried out.  I then used approximately 10 mL of 0.5% Marcaine without epinephrine for postoperative comfort and then closed the cord structures below the external oblique with a running 0 Polysorb suture and irrigated with normal saline solution and stapled the skin incision closed.  Prior to closure, sponge, needle, and instruments were found to be correct.  The Foley catheter was removed.  There were no complications.  The estimated blood loss was less than 25 mL.  The patient received 1100 mL of crystalloids intraoperatively.  There were no complications.     Taquilla Downum, M.D.     WB/MEDQ  D:  08/21/2013  T:  08/21/2013  Job:  [id number] 

## 2013-08-21 NOTE — Anesthesia Postprocedure Evaluation (Signed)
  Anesthesia Post-op Note  Patient: Eddie [name]  Procedure(s) Performed: Procedure(s): LEFT HERNIA REPAIR INGUINAL ADULT (Left)  Patient Location: PACU  Anesthesia Type:General  Level of Consciousness: sedated and patient cooperative  Airway and Oxygen Therapy: Patient Spontanous Breathing and non-rebreather face mask  Post-op Pain: none  Post-op Assessment: Post-op Vital signs reviewed, Patient's Cardiovascular Status Stable, Respiratory Function Stable and Patent Airway  Post-op Vital Signs: Reviewed and stable  Last Vitals:  Filed Vitals:   08/21/13 0931  BP: 151/44  Pulse: 102  Temp: 36.6 C  Resp: 25    Complications: No apparent anesthesia complications

## 2013-08-21 NOTE — Anesthesia Procedure Notes (Signed)
Procedure Name: LMA Insertion Date/Time: 08/21/2013 7:36 AM Performed by: Franco NonesYATES, Aalia Greulich S Pre-anesthesia Checklist: Patient identified, Patient being monitored, Emergency Drugs available, Timeout performed and Suction available Patient Re-evaluated:Patient Re-evaluated prior to inductionOxygen Delivery Method: Circle System Utilized Preoxygenation: Pre-oxygenation with 100% oxygen Intubation Type: IV induction Ventilation: Mask ventilation without difficulty LMA: LMA inserted LMA Size: 5.0 Number of attempts: 1 Placement Confirmation: positive ETCO2 and breath sounds checked- equal and bilateral Tube secured with: Tape

## 2013-08-21 NOTE — Transfer of Care (Signed)
Immediate Anesthesia Transfer of Care Note  Patient: Eddie MangesJonathan L [name]  Procedure(s) Performed: Procedure(s): LEFT HERNIA REPAIR INGUINAL ADULT (Left)  Patient Location: PACU  Anesthesia Type:General  Level of Consciousness: sedated and patient cooperative  Airway & Oxygen Therapy: Patient Spontanous Breathing and non-rebreather face mask  Post-op Assessment: Report given to PACU RN, Post -op Vital signs reviewed and stable and Patient moving all extremities X 4  Post vital signs: Reviewed and stable  Complications: No apparent anesthesia complications

## 2013-08-21 NOTE — Consult Note (Signed)
NAME:  Eddie [name], Eddie [name]           ACCOUNT NO.:  [id number]  MEDICAL RECORD NO.:  [id number]  LOCATION:                                 FACILITY:  PHYSICIAN:  Barbaraann BarthelWilliam Lariza Cothron, M.D. DATE OF BIRTH:  02-05-1991  DATE OF CONSULTATION:  08/18/2013 DATE OF DISCHARGE:                                CONSULTATION   NOTE:  This is a 23 year old African American who presented to Dr. Adaline SillHill's office with a left groin mass consistent with an inguinal hernia. He was referred to my office.  We saw him on August 18, 2013, at which time I concurred with that diagnosis.  In essence, he has a easily reducible, moderate size left inguinal hernia that has been present according to him for at least 1-1/2 years.  PAST MEDICAL HISTORY:  Unremarkable.  PAST SURGICAL HISTORY:  Unremarkable.  SOCIAL HISTORY:  He is a nonsmoker and nondrinker.  MEDICATIONS:  He takes no medications on a regular basis.  PHYSICAL EXAMINATION:  VITAL SIGNS:  He is 5 feet, 7 inches, weighs 165 pounds.  Temperature is 97.7, pulse is 64,  respirations 12, blood pressure 120/78. HEENT:  Head is normocephalic.  Eyes, extraocular movements are intact. Pupils are round and reactive to light and accommodation.  There is no conjunctival pallor or scleral injection.  The sclera has a normal tincture.  Nose and oral mucosa are moist. NECK:  Supple and cylindrical without jugular vein distention, thyromegaly, or tracheal deviation, and the patient has no suspicious adenopathy.  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 CHEST:  Clear both anterior and posterior auscultation. HEART:  Regular rhythm. ABDOMEN:  Soft.  There is no visceromegaly.  The patient was worked up in the past for a left costal margin cartilaginous mass which has not increased in size or bothered him any and which was worked up by [facility] and was found to be a benign  process. RECTAL:  Deferred.  Left inguinal hernia as mentioned. EXTREMITIES:  Without clubbing, varicosities, or cyanosis, or edema.  REVIEW OF SYSTEMS:  NEURO:  No history of migraines.  Pt had head trauma from a sporting accident and had seizures from this.  The last seizure was 3 yrs ago.  He is being followed by Dr. Loleta ChanceHill and is currently on no anti seizure medicine.    No lateralizing neurological findings.  ENDOCRINE:  No history of diabetes, thyroid disease, or adrenal problems.  CARDIOPULMONARY:  No history of heart or lung problems.  The patient is a nonsmoker and nondrinker. MUSCULOSKELETAL:  As noted, he had, had a past history of a left costal margin cartilaginous mass which was negative for which is a benign process.  GI:  No history of hepatitis, constipation, diarrhea, bright red rectal bleeding, black tarry stools, or unexplained weight loss.  No history of inflammatory bowel disease or irritable bowel syndrome.  The patient has never had a colonoscopy.  GU:  No history of frequency, dysuria, or kidney stones.  SKIN/INTEGUMENT:  As mentioned, he has the hypopigmentation signs of pityriasis versicolor affecting the face, arms, and trunk area.  REVIEW OF HISTORY AND  PHYSICAL:  Therefore, Mr. Janee Mornhompson is a 23 year old African American with a left inguinal hernia.  We will plan for elective repair via the outpatient department.  We discussed complications not limited to, but including bleeding, infection, and recurrence, and the possibility that mesh prosthesis might be required. Informed consent was obtained.     Barbaraann BarthelWilliam Bary [name], M.D.     WB/MEDQ  D:  08/18/2013  T:  08/18/2013  Job:  [id number]  cc:   Annia FriendlyGerald K. Loleta ChanceHill, MD Fax: [phone]

## 2013-08-21 NOTE — Op Note (Signed)
NAME:  Eddie [name], Eddie [name]           ACCOUNT NO.:  [id number]  MEDICAL RECORD NO.:  [id number]  LOCATION:  DOIB                          FACILITY:  [facility]  PHYSICIAN:  Barbaraann BarthelWilliam Ruven Corradi, M.D. DATE OF BIRTH:  02/07/1991  DATE OF PROCEDURE:  08/21/2013 DATE OF DISCHARGE:  08/22/2013                              OPERATIVE REPORT   PREOPERATIVE DIAGNOSIS:  Left inguinal hernia.  POSTOPERATIVE DIAGNOSIS:  Left inguinal hernia.  GROSS OPERATIVE FINDINGS:  The patient had an incarcerated left inguinal hernia with incarcerated omentum.  There was no infarction of tissue.  WOUND CLASSIFICATION:  Clean.  PROCEDURE:  Left inguinal herniorrhaphy (no mesh required, modified McVay repair).  NOTE:  This is a 23 year old African American who presented to Dr. Adaline SillHill's Service with a left inguinal hernia.  He was referred to me for elective repair.  We did this via the outpatient department.  Prior to surgery, we discussed complications not limited to but including bleeding, infection, and recurrence and informed consent was obtained.  TECHNIQUE:  The patient was placed in the supine position.  After the adequate administration of LMA anesthesia, he was prepped with Betadine solution and draped in usual manner.  A Foley catheter was aseptically inserted.  An incision was carried out between the anterior-superior iliac spine in the pubic tubercle.  Skin, subcutaneous tissue and Scarpa's layer was incised down to the hernia sac, this was a large hernia sac as the patient had incarcerated omentum within this.  We dissected the sac free from the cord structures.  Then, under direct vision, I opened the sac, released the omentum, and a portion of it was removed within the sac where it was incarcerated.  The omentum was ligated with 2-0 silk and returned to the intra-abdominal cavity.  We then ligated the hernia sac with 2-0 Bralon doubly ligating this and then repairing the direct portion of the hernia  sac with 2-0 Bralon sutures in the interrupted fashion suturing the transversus abdominis and transversalis fascia to Cooper ligament and Poupart ligament with interrupted sutures.  Prior to cinching these sutures tightly, the relaxing incision was carried out.  I then used approximately 10 mL of 0.5% Marcaine without epinephrine for postoperative comfort and then closed the cord structures below the external oblique with a running 0 Polysorb suture and irrigated with normal saline solution and stapled the skin incision closed.  Prior to closure, sponge, needle, and instruments were found to be correct.  The Foley catheter was removed.  There were no complications.  The estimated blood loss was less than 25 mL.  The patient received 1100 mL of crystalloids intraoperatively.  There were no complications.     Barbaraann BarthelWilliam Liyla Radliff, M.D.     WB/MEDQ  D:  08/21/2013  T:  08/21/2013  Job:  [id number]

## 2013-08-22 ENCOUNTER — Encounter (HOSPITAL_COMMUNITY): Payer: Self-pay | Admitting: General Surgery

## 2013-08-22 LAB — CBC WITH DIFFERENTIAL/PLATELET
Basophils Absolute: 0 10*3/uL (ref 0.0–0.1)
Basophils Relative: 0 % (ref 0–1)
EOS PCT: 1 % (ref 0–5)
Eosinophils Absolute: 0.1 10*3/uL (ref 0.0–0.7)
HEMATOCRIT: 36.8 % — AB (ref 39.0–52.0)
Hemoglobin: 12.4 g/dL — ABNORMAL LOW (ref 13.0–17.0)
LYMPHS ABS: 1.7 10*3/uL (ref 0.7–4.0)
LYMPHS PCT: 18 % (ref 12–46)
MCH: 28.2 pg (ref 26.0–34.0)
MCHC: 33.7 g/dL (ref 30.0–36.0)
MCV: 83.8 fL (ref 78.0–100.0)
Monocytes Absolute: 1.5 10*3/uL — ABNORMAL HIGH (ref 0.1–1.0)
Monocytes Relative: 16 % — ABNORMAL HIGH (ref 3–12)
NEUTROS ABS: 6.4 10*3/uL (ref 1.7–7.7)
Neutrophils Relative %: 65 % (ref 43–77)
Platelets: 191 10*3/uL (ref 150–400)
RBC: 4.39 MIL/uL (ref 4.22–5.81)
RDW: 13.5 % (ref 11.5–15.5)
WBC: 9.7 10*3/uL (ref 4.0–10.5)

## 2013-08-22 MED ORDER — OXYCODONE-ACETAMINOPHEN 10-325 MG PO TABS: 1.0000 | ORAL_TABLET | ORAL | Status: DC | PRN

## 2013-08-22 MED ORDER — DSS 100 MG PO CAPS: 100.0000 mg | ORAL_CAPSULE | Freq: Two times a day (BID) | ORAL | Status: DC

## 2013-08-22 NOTE — Progress Notes (Signed)
POD #1  Filed Vitals:   08/22/13 0602  BP: 148/62  Pulse: 81  Temp: 98.1 F (36.7 C)  Resp: 18   Dressings changed.  Much less discomfort today.  Minimal swelling and no dysuria. Doing well, labs OK.  Discharge and follow up arranged.  Discharge dictated, Dict# [id number].

## 2013-08-22 NOTE — Discharge Summary (Signed)
NAMMichele Mcalpine:  [name], Eddie [name]           ACCOUNT NO.:  [id number]  MEDICAL RECORD NO.:  [id number]  LOCATION:  A320                          FACILITY:  [facility]  PHYSICIAN:  [name], M.D. DATE OF BIRTH:  1990/11/12  DATE OF ADMISSION:  08/21/2013 DATE OF DISCHARGE:  06/16/2015LH                              DISCHARGE SUMMARY   DIAGNOSIS:  Left inguinal hernia (incarcerated).  PROCEDURE:  On August 21, 2013, left inguinal herniorrhaphy without the use of mesh (modified BaristaMcVay repair).  SECONDARY DIAGNOSIS:  History of head trauma with seizures, last seizure being 3 years ago.  NOTE:  This is a 23 year old African American who presented Dr. Adaline SillHill's service with a left inguinal hernia.  We had planned for elective repair as I was able to reduce this in the office.  He was taken to surgery on August 21, 2013, at which time I had a left inguinal herniorrhaphy was carried out.  He was noted to have an interesting incarcerated hernia with a sac containing omentum and lobule of that sac which had an incarcerated portion of omentum within it.  This was dissected free. There was no infarction or any other problems.  The redundant sac and the incarcerated portion were excised and sent as specimen. Postoperatively, the patient did very well.  There was minimal discomfort on his first postoperative day.  Although, he had some incisional discomfort in the first 24 hours early on.  At time of discharge, he was voiding well.  His wound was clean.  He had no dysuria, leg pain, shortness of breath, or chest pain.  His postoperative laboratories was fine.  He will be followed up by Dr. Loleta ChanceHill who will continue to monitor him from a medical standpoint.  For other details, please consult chart.  We will follow up with him on August 28, 2013, and arrangements and followup were made with him and his wife and postoperative instructions were given in detail.     [name], M.D.     WB/MEDQ  D:   08/22/2013  T:  08/22/2013  Job:  [id number]  cc:   Annia FriendlyGerald K. Loleta ChanceHill, MD Fax: [phone]

## 2013-08-22 NOTE — Progress Notes (Signed)
IV removed. Discharge instructions reviewed with patient and wife. Understanding verbalized. Ready for discharge home. 

## 2014-01-22 ENCOUNTER — Ambulatory Visit: Payer: Self-pay | Admitting: Nurse Practitioner

## 2014-01-22 ENCOUNTER — Telehealth: Payer: Self-pay | Admitting: Nurse Practitioner

## 2014-01-22 NOTE — Telephone Encounter (Signed)
No showed for scheduled appointment 

## 2014-01-26 ENCOUNTER — Encounter: Payer: Self-pay | Admitting: Nurse Practitioner

## 2014-05-09 ENCOUNTER — Emergency Department (HOSPITAL_COMMUNITY)
Admission: EM | Admit: 2014-05-09 | Discharge: 2014-05-09 | Disposition: A | Discharge status: Home / Self Care | Payer: Medicaid Other | Attending: Emergency Medicine | Admitting: Emergency Medicine

## 2014-05-09 ENCOUNTER — Encounter (HOSPITAL_COMMUNITY): Payer: Self-pay | Admitting: *Deleted

## 2014-05-09 DIAGNOSIS — G40909 Epilepsy, unspecified, not intractable, without status epilepticus: Secondary | ICD-10-CM | POA: Diagnosis not present

## 2014-05-09 DIAGNOSIS — R569 Unspecified convulsions: Secondary | ICD-10-CM | POA: Diagnosis present

## 2014-05-09 MED ORDER — SODIUM CHLORIDE 0.9 % IV BOLUS (SEPSIS): 1000.0000 mL | Freq: Once | INTRAVENOUS | Status: DC

## 2014-05-09 NOTE — Discharge Instructions (Signed)
Driving and Equipment Restrictions Some medical problems make it dangerous to drive, ride a bike, or use machines. Some of these problems are:  A hard blow to the head (concussion).  Passing out (fainting).  Twitching and shaking (seizures).  Low blood sugar.  Taking medicine to help you relax (sedatives).  Taking pain medicines.  Wearing an eye patch.  Wearing splints. This can make it hard to use parts of your body that you need to drive safely. HOME CARE   Do not drive until your doctor says it is okay.  Do not use machines until your doctor says it is okay. You may need a form signed by your doctor (medical release) before you can drive again. You may also need this form before you do other tasks where you need to be fully alert. MAKE SURE YOU:  Understand these instructions.  Will watch your condition.  Will get help right away if you are not doing well or get worse. Document Released: 04/02/2004 Document Revised: 05/18/2011 Document Reviewed: 07/03/2009 [facility] Patient Information 2015 [place] [place]. This information is not intended to replace advice given to you by your health care provider. Make sure you discuss any questions you have with your health care provider.   You will need to see the neurologist. No driving until cleared by the neurologist. Return if worse

## 2014-05-09 NOTE — ED Notes (Signed)
Pt brought in by [facility] for c/o seizure; when ems arrived pt was found to be post-ictal; mother states the seizure lasted approximately 10 mins; ems states pt became a little combative until he became more alert; pt is alert upon arrival to ED

## 2014-05-09 NOTE — ED Notes (Signed)
Went into room to start IV and draw labs and pt declined once again. EDP made aware that pt and family states he is ready for d/c

## 2014-05-09 NOTE — ED Notes (Signed)
Pt refusing vitals at discharge.

## 2014-05-09 NOTE — ED Provider Notes (Signed)
CSN: [id number]     Arrival date & time 05/09/14  1159 History  This chart was scribed for Eddie Hutching, MD by Eddie [name], ED Scribe. This patient was seen in room APA14/[facility]14 and the patient's care was started at 12:39 PM.    Chief Complaint  Patient presents with  . Seizures   The history is provided by the patient and medical records. No language interpreter was used.   Level 5 Caveat- Post-ictal state  HPI Comments: Eddie [name] is a 24 y.o. male with PMhx of seizures was brought to the Emergency Department by EMS complaining of a seizure. He was found by EMS post-ictal. Mother states that the seizure lasted approximately 15-20 minutes. Pt's last reported seizure was in 2013. Mother states that the pt's seizures were more frequent but they slowed down. Pt does not recall having a seizure. He used to take dilantin, but stopped because he was not having any seizures. Pt does not smoke, drink, or use illicit drugs. Pt denies any fever, neck pain, sore throat, visual disturbance, CP, cough, SOB, abdominal pain, nausea, emesis, diarrhea, urinary symptoms, back pain, HA, weakness, numbness and rash as associated symptoms.     Past Medical History  Diagnosis Date  . Seizures     last one 2013   Past Surgical History  Procedure Laterality Date  . Inguinal hernia repair  08/21/2013  . Inguinal hernia repair Left 08/21/2013    Procedure: LEFT HERNIA REPAIR INGUINAL ADULT;  Surgeon: Eddie Hatcher, MD;  Location: [facility];  Service: General;  Laterality: Left;   Family History  Problem Relation Age of Onset  . Seizures Father   . Hypertension Father   . Headache Mother    History  Substance Use Topics  . Smoking status: Never Smoker   . Smokeless tobacco: Never Used  . Alcohol Use: No    Review of Systems  Unable to perform ROS: Other   Allergies  Dilantin and Divalproex sodium  Home Medications   Prior to Admission medications   Medication Sig Start Date End Date  Taking? Authorizing Provider  docusate sodium 100 MG CAPS Take 100 mg by mouth 2 (two) times daily. Patient not taking: Reported on 05/09/2014 08/22/13   Eddie Hatcher, MD  oxyCODONE-acetaminophen (PERCOCET) 10-325 MG per tablet Take 1 tablet by mouth every 4 (four) hours as needed for pain. Patient not taking: Reported on 05/09/2014 08/22/13   Eddie Hatcher, MD   BP 135/69 mmHg  Pulse 87  Temp(Src) 98.7 F (37.1 C) (Oral)  Resp 18  SpO2 100%  Physical Exam  Constitutional: He is oriented to person, place, and time. He appears well-developed and well-nourished.  Pt was answering questions slowly but appropriately. Pt appeared angry.  HENT:  Head: Normocephalic and atraumatic.  Eyes: Conjunctivae and EOM are normal. Pupils are equal, round, and reactive to light.  Neck: Normal range of motion. Neck supple.  Cardiovascular: Normal rate and regular rhythm.   Pulmonary/Chest: Effort normal and breath sounds normal.  Abdominal: Soft. Bowel sounds are normal.  Musculoskeletal: Normal range of motion.  Pt moving all 4 extremities.  Neurological: He is alert and oriented to person, place, and time.  Skin: Skin is warm and dry.  Psychiatric: He has a normal mood and affect. His behavior is normal.  Nursing note and vitals reviewed.   ED Course  Procedures (including critical care time)  DIAGNOSTIC STUDIES: Oxygen Saturation is 100% on RA, normal by my interpretation.  COORDINATION OF CARE: 12:50 PM- Essential lab test were discussed with the pt and family but he is declining any labs or imaging. Family will further discuss getting labs and imaging done with the pt. Pt advised of plan for treatment and pt agrees.  Labs Review Labs Reviewed  BASIC METABOLIC PANEL  CBC WITH DIFFERENTIAL/PLATELET  URINE RAPID DRUG SCREEN (HOSP PERFORMED)  ETHANOL    Imaging Review No results found.   EKG Interpretation None      MDM   Final diagnoses:  Seizure    Patient has had  a seizure in the past. He is presently off seizure medications. He is slightly sleepy, but he answers questions appropriately. He is refusing any blood work or scanning. I discussed the entire situation with the patient and his mother. I recommended blood work and possibly starting medication. Patient has chosen not to get any testing. Driving restrictions were discussed.  I personally performed the services described in this documentation, which was scribed in my presence. The recorded information has been reviewed and is accurate.    Eddie HutchingBrian Jakeb Lamping, MD 05/09/14 [phone]

## 2014-05-09 NOTE — ED Notes (Signed)
Pt is refusing to remove shirt and is refusing any labs or IV; mom in room at this time

## 2015-02-12 ENCOUNTER — Other Ambulatory Visit: Payer: Medicaid Other | Admitting: Family Medicine

## 2015-02-12 ENCOUNTER — Other Ambulatory Visit (HOSPITAL_COMMUNITY)
Admission: RE | Admit: 2015-02-12 | Discharge: 2015-02-12 | Disposition: A | Discharge status: Home / Self Care | Payer: Medicaid Other | Source: Ambulatory Visit | Attending: Family Medicine | Admitting: Family Medicine

## 2015-02-12 DIAGNOSIS — Z113 Encounter for screening for infections with a predominantly sexual mode of transmission: Secondary | ICD-10-CM | POA: Insufficient documentation

## 2015-02-15 LAB — URINE CYTOLOGY ANCILLARY ONLY
BACTERIAL VAGINITIS: NEGATIVE
Candida vaginitis: NEGATIVE
Chlamydia: NEGATIVE
NEISSERIA GONORRHEA: NEGATIVE
Trichomonas: NEGATIVE

## 2015-03-02 ENCOUNTER — Emergency Department (HOSPITAL_COMMUNITY)
Admission: EM | Admit: 2015-03-02 | Discharge: 2015-03-03 | Disposition: A | Discharge status: Home / Self Care | Payer: Medicaid Other | Attending: Emergency Medicine | Admitting: Emergency Medicine

## 2015-03-02 ENCOUNTER — Encounter (HOSPITAL_COMMUNITY): Payer: Self-pay | Admitting: *Deleted

## 2015-03-02 DIAGNOSIS — G40909 Epilepsy, unspecified, not intractable, without status epilepticus: Secondary | ICD-10-CM

## 2015-03-02 DIAGNOSIS — R569 Unspecified convulsions: Secondary | ICD-10-CM | POA: Diagnosis present

## 2015-03-02 LAB — BASIC METABOLIC PANEL
ANION GAP: 11 (ref 5–15)
BUN: 11 mg/dL (ref 6–20)
CO2: 23 mmol/L (ref 22–32)
Calcium: 8.8 mg/dL — ABNORMAL LOW (ref 8.9–10.3)
Chloride: 106 mmol/L (ref 101–111)
Creatinine, Ser: 1 mg/dL (ref 0.61–1.24)
GFR calc Af Amer: >60 mL/min (ref >60)
Glucose, Bld: 100 mg/dL — ABNORMAL HIGH (ref 65–99)
POTASSIUM: 3.3 mmol/L — AB (ref 3.5–5.1)
Sodium: 140 mmol/L (ref 135–145)

## 2015-03-02 LAB — ETHANOL: ALCOHOL ETHYL (B): 77 mg/dL — AB (ref <5)

## 2015-03-02 LAB — CBC
HCT: 40 % (ref 39.0–52.0)
Hemoglobin: 13.4 g/dL (ref 13.0–17.0)
MCH: 27.9 pg (ref 26.0–34.0)
MCHC: 33.5 g/dL (ref 30.0–36.0)
MCV: 83.2 fL (ref 78.0–100.0)
PLATELETS: 169 10*3/uL (ref 150–400)
RBC: 4.81 MIL/uL (ref 4.22–5.81)
RDW: 13.2 % (ref 11.5–15.5)
WBC: 8.3 10*3/uL (ref 4.0–10.5)

## 2015-03-02 MED ORDER — LEVETIRACETAM IN NACL 1000 MG/100ML IV SOLN
1000.0000 mg | Freq: Once | INTRAVENOUS | Status: AC
  Administered 2015-03-02: 1000 mg via INTRAVENOUS
  Filled 2015-03-02: qty 100

## 2015-03-02 NOTE — ED Notes (Signed)
[facility] witnessed seizures tonight, has hx of seizure, family reports that pt has not been eating and sleeping well, cbg 142. Has been drinking etoh tonight,

## 2015-03-02 NOTE — ED Provider Notes (Signed)
CSN: [id number]     Arrival date & time 03/02/15  2146 History   First MD Initiated Contact with Patient 03/02/15 2145     Chief Complaint  Patient presents with  . Seizures     (Consider location/radiation/quality/duration/timing/severity/associated sxs/prior Treatment) HPI Comments: The patient is a 24 year old male according to the electronic medical record the patient is had multiple seizures in the past, last seen in March 2016 when he arrived after having a seizure and refused treatment or evaluation. He presents to the hospital today by ambulance transport after they were called for several witnessed seizures. These lasted only several minutes, they resolved each time, after the paramedics arrived they did not witness any seizure activity. Reportedly the patient's blood sugar was 150, he had some urinary incontinence, he has been obtunded and not able to speak for them. Vital signs were overall unremarkable for the paramedics. There has been no difficulty with breathing  Patient is a 24 y.o. male presenting with seizures. The history is provided by the EMS personnel.  Seizures   Past Medical History  Diagnosis Date  . Seizures (HCC)     last one 2013   Past Surgical History  Procedure Laterality Date  . Inguinal hernia repair  08/21/2013  . Inguinal hernia repair Left 08/21/2013    Procedure: LEFT HERNIA REPAIR INGUINAL ADULT;  Surgeon: Marlane Hatcher, MD;  Location: [facility];  Service: General;  Laterality: Left;   Family History  Problem Relation Age of Onset  . Seizures Father   . Hypertension Father   . Headache Mother    Social History  Substance Use Topics  . Smoking status: Never Smoker   . Smokeless tobacco: Never Used  . Alcohol Use: No    Review of Systems  Neurological: Positive for seizures.  All other systems reviewed and are negative.     Allergies  Dilantin and Divalproex sodium  Home Medications   Prior to Admission medications    Medication Sig Start Date End Date Taking? Authorizing Provider  docusate sodium 100 MG CAPS Take 100 mg by mouth 2 (two) times daily. Patient not taking: Reported on 05/09/2014 08/22/13   Barbaraann Barthel, MD  levETIRAcetam (KEPPRA) 500 MG tablet Take 1 tablet (500 mg total) by mouth 2 (two) times daily. 03/03/15   Devoria Albe, MD  oxyCODONE-acetaminophen (PERCOCET) 10-325 MG per tablet Take 1 tablet by mouth every 4 (four) hours as needed for pain. Patient not taking: Reported on 05/09/2014 08/22/13   Barbaraann Barthel, MD   BP 123/91 mmHg  Pulse 92  Temp(Src) 97.4 F (36.3 C) (Oral)  Resp 16  SpO2 100% Physical Exam  Constitutional: He appears well-developed and well-nourished.  HENT:  Head: Normocephalic and atraumatic.  Mouth/Throat: Oropharynx is clear and moist. No oropharyngeal exudate.  No obvious tongue biting  Eyes: Conjunctivae and EOM are normal. Pupils are equal, round, and reactive to light. Right eye exhibits no discharge. Left eye exhibits no discharge. No scleral icterus.  Neck: Normal range of motion. Neck supple. No JVD present. No thyromegaly present.  Cardiovascular: Normal rate, regular rhythm, normal heart sounds and intact distal pulses.  Exam reveals no gallop and no friction rub.   No murmur heard. Pulmonary/Chest: Effort normal and breath sounds normal. No respiratory distress. He has no wheezes. He has no rales.  Abdominal: Soft. Bowel sounds are normal. He exhibits no distension and no mass. There is no tenderness.  Musculoskeletal: Normal range of motion. He exhibits no edema or tenderness.  Lymphadenopathy:    He has no cervical adenopathy.  Neurological: He is alert. Coordination normal.  Somnolent but arousable to deep painful stimuli, he is able to raise each of his arms with assistance, he falls asleep rather quickly, he moves all 4 extremities to painful stimuli, opens his eyes to pain  Skin: Skin is warm and dry. No rash noted. No erythema.  Psychiatric:  He has a normal mood and affect. His behavior is normal.  Nursing note and vitals reviewed.   ED Course  Procedures (including critical care time) Labs Review Labs Reviewed  BASIC METABOLIC PANEL - Abnormal; Notable for the following:    Potassium 3.3 (*)    Glucose, Bld 100 (*)    Calcium 8.8 (*)    All other components within normal limits  ETHANOL - Abnormal; Notable for the following:    Alcohol, Ethyl (B) 77 (*)    All other components within normal limits  CBC    Imaging Review No results found. I have personally reviewed and evaluated these images and lab results as part of my medical decision-making.    MDM   Final diagnoses:  Seizure disorder [facility](HCC)    The patient has had a recurrent seizure 3 this evening, he does not have any seizures at this time, he received Zofran but no medications for seizures prior to hospital. He has a listed allergy to Dilantin and thus we will order Keppra, keep him for observation until he awakes and have a discussion with the patient regarding need for ongoing treatment. Reportedly the patient has had several days of not having very much sleep followed by drinking some alcohol this evening.  Change of shift - care signed out to Dr. Devoria AlbeIva Knapp to follow up pt as he returns to baseline from what is likely a post ictal states - VS remained normal during my tenure as his physician  Eber HongBrian Yoshiye Kraft, MD 03/03/15 [phone]

## 2015-03-02 NOTE — ED Notes (Signed)
Seizure pads in place on bed,

## 2015-03-02 NOTE — ED Notes (Signed)
Pt given 4 mg zofran while enroute to er,  

## 2015-03-03 MED ORDER — LEVETIRACETAM 500 MG PO TABS
500.0000 mg | ORAL_TABLET | Freq: Two times a day (BID) | ORAL | Status: DC
Start: 2015-03-03 — End: 2017-04-23

## 2015-03-03 NOTE — Discharge Instructions (Signed)
Start taking the keppra twice a day to prevent seizures. Call Dr Ronal Fearoonquah's office, he's a neurologist, to be evaluated and to manage your seizure medication.  NO DRIVING, CLIMBING LADDERS OR ANY ACTIVITY THAT YOU COULD GET HURT IF YOU HAVE ANOTHER SEIZURE!!!     Epilepsy People with epilepsy have times when they shake and jerk uncontrollably (seizures). This happens when there is a sudden change in brain function. Epilepsy may have many possible causes. Anything that disturbs the normal pattern of brain cell activity can lead to seizures. HOME CARE   Follow your doctor's instructions about driving and safety during normal activities.  Get enough sleep.  Only take medicine as told by your doctor.  Avoid things that you know can cause you to have seizures (triggers).  Write down when your seizures happen and what you remember about each seizure. Write down anything you think may have caused the seizure to happen.  Tell the people you live and work with that you have seizures. Make sure they know how to help you. They should:  Cushion your head and body.  Turn you on your side.  Not restrain you.  Not place anything inside your mouth.  Call for local emergency medical help if there is any question about what has happened.  Keep all follow-up visits with your doctor. This is very important. GET HELP IF:  You get an infection or start to feel sick. You may have more seizures when you are sick.  You are having seizures more often.  Your seizure pattern is changing. GET HELP RIGHT AWAY IF:   A seizure does not stop after a few seconds or minutes.  A seizure causes you to have trouble breathing.  A seizure gives you a very bad headache.  A seizure makes you unable to speak or use a part of your body.   This information is not intended to replace advice given to you by your health care provider. Make sure you discuss any questions you have with your health care provider.     Document Released: 12/21/2008 Document Revised: 12/14/2012 Document Reviewed: 10/05/2012 Elsevier Interactive Patient Education [employer]2016 [employer]

## 2015-03-03 NOTE — ED Provider Notes (Signed)
By signing my name below, I, [name], attest that this documentation has been prepared under the direction and in the presence of [name] , MD. Electronically Signed: Angelene GiovanniEmmanuella Mensah, ED Scribe. 03/03/2015. 12:20 AM.    HPI Comments: Eddie MangesJonathan L Kopinski is a 24 y.o. male who presents to the Emergency Department complaining of witnessed seizures that occurred PTA. The witnesses say that he had two-three episodes after drinking ETOH. They add that the first episode was unwitnessed but the second one was witnessed. He states that his last seizure was in March when he had a fever. Pt has a hx of seizures from childhood. He had been off seizure medication for about 3 years.   12:20 AM - Still somnolent. Will answer questions but very sleepy.   3 AM patient sleeping and snoring however he's much easier to awaken and he is answering questions quicker and more appropriately. I will have staff get patient up and ambulate him if he is able to ambulate well he can be discharged.  Pt was ambulated by nursing staff and did not need assistance. Ready for discharge.   Patient cautioned about driving or climbing ladders or any activity that he could get hurt if he had another seizure. He was referred to Dr. Gerilyn Pilgrimoonquah, neurologist.  Diagnoses that have been ruled out:  None  Diagnoses that are still under consideration:  None  Final diagnoses:  Seizure disorder (HCC)    New Prescriptions   LEVETIRACETAM (KEPPRA) 500 MG TABLET    Take 1 tablet (500 mg total) by mouth 2 (two) times daily.    Plan discharge  Devoria AlbeIva Azka Steger, MD, Concha PyoFACEP    Colburn Asper, MD 03/03/15 [phone]

## 2015-05-26 ENCOUNTER — Encounter (HOSPITAL_COMMUNITY): Payer: Self-pay | Admitting: *Deleted

## 2015-05-26 ENCOUNTER — Emergency Department (HOSPITAL_COMMUNITY): Payer: Medicaid Other

## 2015-05-26 ENCOUNTER — Emergency Department (HOSPITAL_COMMUNITY)
Admission: EM | Admit: 2015-05-26 | Discharge: 2015-05-26 | Disposition: A | Discharge status: Home / Self Care | Payer: Medicaid Other | Attending: Emergency Medicine | Admitting: Emergency Medicine

## 2015-05-26 DIAGNOSIS — R0789 Other chest pain: Secondary | ICD-10-CM | POA: Insufficient documentation

## 2015-05-26 DIAGNOSIS — S59902A Unspecified injury of left elbow, initial encounter: Secondary | ICD-10-CM | POA: Diagnosis present

## 2015-05-26 DIAGNOSIS — I10 Essential (primary) hypertension: Secondary | ICD-10-CM | POA: Diagnosis not present

## 2015-05-26 DIAGNOSIS — W06XXXA Fall from bed, initial encounter: Secondary | ICD-10-CM | POA: Insufficient documentation

## 2015-05-26 DIAGNOSIS — Y939 Activity, unspecified: Secondary | ICD-10-CM | POA: Diagnosis not present

## 2015-05-26 DIAGNOSIS — Y92003 Bedroom of unspecified non-institutional (private) residence as the place of occurrence of the external cause: Secondary | ICD-10-CM | POA: Diagnosis not present

## 2015-05-26 DIAGNOSIS — Y999 Unspecified external cause status: Secondary | ICD-10-CM | POA: Insufficient documentation

## 2015-05-26 DIAGNOSIS — R55 Syncope and collapse: Secondary | ICD-10-CM | POA: Diagnosis not present

## 2015-05-26 DIAGNOSIS — S53402A Unspecified sprain of left elbow, initial encounter: Secondary | ICD-10-CM | POA: Diagnosis not present

## 2015-05-26 LAB — BASIC METABOLIC PANEL
ANION GAP: 9 (ref 5–15)
BUN: 10 mg/dL (ref 6–20)
CHLORIDE: 106 mmol/L (ref 101–111)
CO2: 25 mmol/L (ref 22–32)
Calcium: 8.4 mg/dL — ABNORMAL LOW (ref 8.9–10.3)
Creatinine, Ser: 0.8 mg/dL (ref 0.61–1.24)
GFR calc non Af Amer: >60 mL/min (ref >60)
Glucose, Bld: 100 mg/dL — ABNORMAL HIGH (ref 65–99)
POTASSIUM: 3.2 mmol/L — AB (ref 3.5–5.1)
Sodium: 140 mmol/L (ref 135–145)

## 2015-05-26 LAB — CBC WITH DIFFERENTIAL/PLATELET
BASOS ABS: 0 10*3/uL (ref 0.0–0.1)
Basophils Relative: 1 %
EOS ABS: 0 10*3/uL (ref 0.0–0.7)
Eosinophils Relative: 1 %
HCT: 38.5 % — ABNORMAL LOW (ref 39.0–52.0)
HEMOGLOBIN: 13.3 g/dL (ref 13.0–17.0)
Lymphocytes Relative: 41 %
Lymphs Abs: 1.7 10*3/uL (ref 0.7–4.0)
MCH: 27.9 pg (ref 26.0–34.0)
MCHC: 34.5 g/dL (ref 30.0–36.0)
MCV: 80.9 fL (ref 78.0–100.0)
Monocytes Absolute: 0.4 10*3/uL (ref 0.1–1.0)
Monocytes Relative: 10 %
Neutro Abs: 2.1 10*3/uL (ref 1.7–7.7)
Neutrophils Relative %: 47 %
Platelets: 194 10*3/uL (ref 150–400)
RBC: 4.76 MIL/uL (ref 4.22–5.81)
RDW: 12.4 % (ref 11.5–15.5)
WBC: 4.2 10*3/uL (ref 4.0–10.5)

## 2015-05-26 LAB — TROPONIN I: Troponin I: <0.03 ng/mL (ref <0.031)

## 2015-05-26 MED ORDER — KETOROLAC TROMETHAMINE 30 MG/ML IJ SOLN
30.0000 mg | Freq: Once | INTRAMUSCULAR | Status: AC
  Administered 2015-05-26: 30 mg via INTRAVENOUS
  Filled 2015-05-26: qty 1

## 2015-05-26 MED ORDER — ONDANSETRON HCL 4 MG/2ML IJ SOLN
4.0000 mg | Freq: Once | INTRAMUSCULAR | Status: AC
  Administered 2015-05-26: 4 mg via INTRAVENOUS
  Filled 2015-05-26: qty 2

## 2015-05-26 MED ORDER — SODIUM CHLORIDE 0.9 % IV BOLUS (SEPSIS)
1000.0000 mL | Freq: Once | INTRAVENOUS | Status: AC
  Administered 2015-05-26: 1000 mL via INTRAVENOUS

## 2015-05-26 MED ORDER — POTASSIUM CHLORIDE CRYS ER 20 MEQ PO TBCR
40.0000 meq | EXTENDED_RELEASE_TABLET | Freq: Once | ORAL | Status: AC
  Administered 2015-05-26: 40 meq via ORAL
  Filled 2015-05-26: qty 2

## 2015-05-26 NOTE — ED Provider Notes (Signed)
CSN: [id number]     Arrival date & time 05/26/15  0427 History   First MD Initiated Contact with Patient 05/26/15 [phone]     Chief Complaint  Patient presents with  . Loss of Consciousness    Patient is a 25 y.o. male presenting with syncope. The history is provided by the patient.  Loss of Consciousness Episode history:  Single Most recent episode:  Today Timing:  Constant Progression:  Resolved Chronicity:  New Relieved by:  None tried Worsened by:  Nothing tried Associated symptoms: chest pain   Associated symptoms: no seizures   Risk factors: no coronary artery disease   Patient with h/o seizure presents after episode of CP and syncope Pt reports he woke up with chest pressure, and then may have passed out.  Wife reports she found him and he was not fully responding to her questions so she called EMS No seizures reported  Pt with recent flu illness-  He was seen last week at [facility], he had +flu test and placed on tamiflu. He should have completed course of tamiflu  Tonight he reports his cough worsened and he had chest pressure  He reports he may have injured his left elbow in the fall  Past Medical History  Diagnosis Date  . Seizures (HCC)     last one 2013   Past Surgical History  Procedure Laterality Date  . Inguinal hernia repair  08/21/2013  . Inguinal hernia repair Left 08/21/2013    Procedure: LEFT HERNIA REPAIR INGUINAL ADULT;  Surgeon: Marlane Hatcher, MD;  Location: [facility];  Service: General;  Laterality: Left;   Family History  Problem Relation Age of Onset  . Seizures Father   . Hypertension Father   . Headache Mother    Social History  Substance Use Topics  . Smoking status: Never Smoker   . Smokeless tobacco: Never Used  . Alcohol Use: No    Review of Systems  Constitutional: Positive for chills.  Respiratory: Positive for cough.   Cardiovascular: Positive for chest pain and syncope.  Gastrointestinal: Negative for abdominal pain.   Neurological: Positive for syncope. Negative for seizures.  All other systems reviewed and are negative.     Allergies  Dilantin and Divalproex sodium  Home Medications   Prior to Admission medications   Medication Sig Start Date End Date Taking? Authorizing Provider  docusate sodium 100 MG CAPS Take 100 mg by mouth 2 (two) times daily. Patient not taking: Reported on 05/09/2014 08/22/13   Barbaraann Barthel, MD  levETIRAcetam (KEPPRA) 500 MG tablet Take 1 tablet (500 mg total) by mouth 2 (two) times daily. 03/03/15   Devoria Albe, MD  oxyCODONE-acetaminophen (PERCOCET) 10-325 MG per tablet Take 1 tablet by mouth every 4 (four) hours as needed for pain. Patient not taking: Reported on 05/09/2014 08/22/13   Barbaraann Barthel, MD   BP 132/72 mmHg  Pulse 56  Temp(Src) 97.5 F (36.4 C) (Oral)  Resp 36  Ht  (1.778 m)  Wt 81.647 kg  BMI 25.83 kg/m2  SpO2 99% Physical Exam CONSTITUTIONAL: Well developed/well nourished.  He speaks softly during exam HEAD: Normocephalic/atraumatic, no signs of trauma EYES: EOMI/PERRL ENMT: Mucous membranes moist, red coating to tongue but no lacerations noted.   NECK: supple no meningeal signs SPINE/BACK:entire spine nontender CV: S1/S2 noted, no murmurs/rubs/gallops noted LUNGS: Lungs are clear to auscultation bilaterally, no apparent distress Chest - tenderness to palpation ABDOMEN: soft, nontender, no rebound or guarding, bowel sounds noted throughout abdomen  GU:no cva tenderness NEURO: Pt is awake/alert/appropriate, moves all extremitiesx4.  No facial droop.  No arm/leg drift EXTREMITIES: pulses normal/equal, full ROM, tenderness to palpation of left elbow, but no deformity.  Mild tenderness to left shoulder.  All other extremities/joints palpated/ranged and nontender SKIN: warm, color normal PSYCH: no abnormalities of mood noted, alert and oriented to situation  ED Course  Procedures  5:01 AM Pt very poor historian, he gives only minimal  details about tonight's episode Per wife, pt with recent flu illness and taking tamiflu Tonight pt was found after a fall and she felt he was confused He is now awake/alert.   He has reproducible Chest wall tenderness Due to unclear cause of episode tonight, labs/imaging ordered 6:27 AM Pt resting comfortably All workup thus far negative Pt has no new complaints After further discussion, wife reports he was sleeping in the other room as he did not want to get their child sick She found him sleeping in the floor and when he woke up he was very confused, and this is when she called 911.  Though she never saw a seizure, it was possible he had unwitnessed seizure and was in a post-ictal state.  He does not take his AED therapy, and has not had recent f/u with his PCP  advised need for close PCP f/u and need to take his AED therapy Advised he should not drive  I doubt ACS/PE or other acute cardiopulmonary emergency at this time Will d/c home  Labs Review Labs Reviewed  CBC WITH DIFFERENTIAL/PLATELET - Abnormal; Notable for the following:    HCT 38.5 (*)    All other components within normal limits  BASIC METABOLIC PANEL - Abnormal; Notable for the following:    Potassium 3.2 (*)    Glucose, Bld 100 (*)    Calcium 8.4 (*)    All other components within normal limits  TROPONIN I    Imaging Review Dg Chest 2 View  05/26/2015  CLINICAL DATA:  Chest pressure, onset this morning. Fell shortly after that. EXAM: CHEST  2 VIEW COMPARISON:  05/20/2015 FINDINGS: The heart size and mediastinal contours are within normal limits. Both lungs are clear. The visualized skeletal structures are unremarkable. IMPRESSION: No active cardiopulmonary disease. Electronically Signed   By: Ellery Plunkaniel R Mitchell M.D.   On: 05/26/2015 06:01   Dg Elbow Complete Left  05/26/2015  CLINICAL DATA:  Larey SeatFell tonight. EXAM: LEFT ELBOW - COMPLETE 3+ VIEW COMPARISON:  None. FINDINGS: There is no evidence of fracture, dislocation,  or joint effusion. There is no evidence of arthropathy or other focal bone abnormality. Soft tissues are unremarkable. IMPRESSION: Negative. Electronically Signed   By: Ellery Plunkaniel R Mitchell M.D.   On: 05/26/2015 06:01   I have personally reviewed and evaluated these images and lab results as part of my medical decision-making.   EKG Interpretation   Date/Time:  Sunday May 26 2015 04:36:07 EDT Ventricular Rate:  53 PR Interval:  159 QRS Duration: 83 QT Interval:  395 QTC Calculation: 371 R Axis:   80 Text Interpretation:  Sinus or ectopic atrial rhythm Sinus bradycardia ECG  OTHERWISE WITHIN NORMAL LIMITS No significant change since last tracing  Confirmed by Bebe ShaggyWICKLINE  MD, Arlon Bleier ([id number]) on 05/26/2015 4:39:57 AM      MDM   Final diagnoses:  Syncope, unspecified syncope type  Chest wall pain  Elbow sprain, left, initial encounter    Nursing notes including past medical history and social history reviewed and considered in documentation Labs/vital reviewed  myself and considered during evaluation xrays/imaging reviewed by myself and considered during evaluation  I personally performed the services described in this documentation, which was scribed in my presence. The recorded information has been reviewed and is accurate.       Zadie Rhine, MD 05/26/15 0630

## 2015-05-26 NOTE — ED Notes (Addendum)
Pt arrives via EMS. Pt states he was in the bed and began having some chest pressure. Pt states he called for his family and noone came so he got up and fell. Pt unsure what happened after that. Pt reports chest pressure and left arm pain. Pt unsure if the arm pain is from the fall or not. Pt answers questions appropriately with a soft voice. Pt states he was recently treated with Tamiflu. Pt has a hx of seizures.

## 2015-05-26 NOTE — ED Provider Notes (Signed)
Pt was able to ambulate without difficulty in the ED   Zadie Rhineonald Zeyad Delaguila, MD 05/26/15 [phone]

## 2015-05-26 NOTE — Discharge Instructions (Signed)
Please be aware you may have another seizure ° °Do not drive until seen by your physician for your condition ° °Do not climb ladders/roofs/trees as a seizure can occur at that height and cause serious harm ° °Do not bathe/swim alone as a seizure can occur and cause serious harm ° °Please followup with your physician or neurologist for further testing and possible treatment ° ° °

## 2015-08-29 ENCOUNTER — Ambulatory Visit: Payer: Self-pay | Admitting: Neurology

## 2015-09-02 ENCOUNTER — Telehealth: Payer: Self-pay

## 2015-09-02 ENCOUNTER — Encounter (INDEPENDENT_AMBULATORY_CARE_PROVIDER_SITE_OTHER): Payer: Self-pay | Admitting: Neurology

## 2015-09-02 DIAGNOSIS — G40309 Generalized idiopathic epilepsy and epileptic syndromes, not intractable, without status epilepticus: Secondary | ICD-10-CM

## 2015-09-02 NOTE — Telephone Encounter (Signed)
Appt cancelled same day

## 2015-09-03 ENCOUNTER — Encounter: Payer: Self-pay | Admitting: Neurology

## 2015-09-03 NOTE — Progress Notes (Signed)
This patient did not show for appointment.

## 2015-09-05 ENCOUNTER — Telehealth: Payer: Self-pay

## 2015-09-05 ENCOUNTER — Encounter: Payer: Self-pay | Admitting: Neurology

## 2015-09-05 ENCOUNTER — Ambulatory Visit: Payer: Medicaid Other | Admitting: Neurology

## 2015-09-05 NOTE — Telephone Encounter (Signed)
Pt no-showed today's re-scheduled appt.

## 2015-09-06 ENCOUNTER — Encounter: Payer: Self-pay | Admitting: Neurology

## 2016-12-03 ENCOUNTER — Encounter (HOSPITAL_COMMUNITY): Payer: Self-pay | Admitting: *Deleted

## 2016-12-03 ENCOUNTER — Emergency Department (HOSPITAL_COMMUNITY)
Admission: EM | Admit: 2016-12-03 | Discharge: 2016-12-03 | Disposition: A | Discharge status: Home / Self Care | Payer: Medicaid Other | Attending: Emergency Medicine | Admitting: Emergency Medicine

## 2016-12-03 DIAGNOSIS — Z202 Contact with and (suspected) exposure to infections with a predominantly sexual mode of transmission: Secondary | ICD-10-CM

## 2016-12-03 DIAGNOSIS — Z79899 Other long term (current) drug therapy: Secondary | ICD-10-CM | POA: Insufficient documentation

## 2016-12-03 MED ORDER — AZITHROMYCIN 250 MG PO TABS
1000.0000 mg | ORAL_TABLET | Freq: Once | ORAL | Status: AC
  Administered 2016-12-03: 1000 mg via ORAL
  Filled 2016-12-03: qty 4

## 2016-12-03 NOTE — ED Triage Notes (Signed)
States he was advised by spouse of exposure to STD

## 2016-12-03 NOTE — Discharge Instructions (Signed)
As discussed, you have been treated for chlamydia today with the dose of zithromax you received.  You should not have sex for the next 7 days (or until you know your lab test has resulted as negative) .  You should have results back within 48 hours.

## 2016-12-03 NOTE — ED Provider Notes (Signed)
[facility] Provider Note   CSN: [id number] Arrival date & time: 12/03/16  1536     History   Chief Complaint Chief Complaint  Patient presents with  . SEXUALLY TRANSMITTED DISEASE    HPI Eddie [name] is a 26 y.o. male presenting for std screening.  He was advised by his estranged wife that she was treated for chlamydia 5 days ago, they last had intercourse about 2 weeks ago.  He denies any symptoms including penile discharge or dysuria, rash or lesions, fevers or other complaints.  HPI  Past Medical History:  Diagnosis Date  . Seizures (HCC)    last one 2013    Patient Active Problem List   Diagnosis Date Noted  . Inguinal hernia with incarceration 08/21/2013  . 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 INGUINAL HERNIA REPAIR  08/21/2013  . INGUINAL HERNIA REPAIR Left 08/21/2013   Procedure: LEFT HERNIA REPAIR INGUINAL ADULT;  Surgeon: Marlane Hatcher, MD;  Location: [facility];  Service: General;  Laterality: Left;       Home Medications    Prior to Admission medications   Medication Sig Start Date End Date Taking? Authorizing Provider  docusate sodium 100 MG CAPS Take 100 mg by mouth 2 (two) times daily. Patient not taking: Reported on 05/09/2014 08/22/13   Barbaraann Barthel, MD  levETIRAcetam (KEPPRA) 500 MG tablet Take 1 tablet (500 mg total) by mouth 2 (two) times daily. 03/03/15   Devoria Albe, MD  oxyCODONE-acetaminophen (PERCOCET) 10-325 MG per tablet Take 1 tablet by mouth every 4 (four) hours as needed for pain. Patient not taking: Reported on 05/09/2014 08/22/13   Barbaraann Barthel, MD    Family History Family History  Problem Relation Age of Onset  . Headache Mother   . Seizures Father   . Hypertension Father     Social History Social History  Substance Use Topics  . Smoking status: Never Smoker  . Smokeless tobacco: Never Used  . Alcohol use No     Allergies     Dilantin [phenytoin sodium extended] and Divalproex sodium   Review of Systems Review of Systems  Constitutional: Negative for fever.  HENT: Negative for congestion and sore throat.   Eyes: Negative.   Respiratory: Negative for chest tightness and shortness of breath.   Cardiovascular: Negative for chest pain.  Gastrointestinal: Negative for abdominal pain and nausea.  Genitourinary: Negative.   Musculoskeletal: Negative for arthralgias, joint swelling and neck pain.  Skin: Negative.  Negative for rash and wound.  Neurological: Negative for dizziness, weakness, light-headedness, numbness and headaches.  Psychiatric/Behavioral: Negative.      Physical Exam Updated Vital Signs BP (!) 144/84 (BP Location: Right Arm)   Pulse 73   Temp 98.3 F (36.8 C) (Oral)   Resp 18   SpO2 100%   Physical Exam  Constitutional: He appears well-developed and well-nourished.  HENT:  Head: Normocephalic and atraumatic.  Eyes: Conjunctivae are normal.  Neck: Normal range of motion.  Cardiovascular: Normal rate.   Pulmonary/Chest: Effort normal. He has no wheezes.  Genitourinary:  Genitourinary Comments: deferred  Musculoskeletal: Normal range of motion.  Neurological: He is alert.  Skin: Skin is warm and dry.  Psychiatric: He has a normal mood and affect.  Nursing note and vitals reviewed.    ED Treatments / Results  Labs (all labs ordered are listed, but only abnormal results are displayed) Labs Reviewed  HIV ANTIBODY (ROUTINE TESTING)  RPR  GC/CHLAMYDIA PROBE AMP ([place]) NOT AT [facility]    EKG  EKG Interpretation None       Radiology No results found.  Procedures Procedures (including critical care time)  Medications Ordered in ED Medications  azithromycin (ZITHROMAX) tablet 1,000 mg (1,000 mg Oral Given 12/03/16 1649)     Initial Impression / Assessment and Plan / ED Course  I have reviewed the triage vital signs and the nursing notes.  Pertinent labs &  imaging results that were available during my care of the patient were reviewed by me and considered in my medical decision making (see chart for details).     Pt asymptomatic but with known exposure to chlamydia.  Screening obtained gc/chlamydia per urine sample.  Pt also agreed to hiv/syphilis testing.  He was given zithromax here, advised labs pending and will be notified if positive.  His questions were answered during this visit.  Final Clinical Impressions(s) / ED Diagnoses   Final diagnoses:  STD exposure    New Prescriptions New Prescriptions   No medications on file     Victoriano Lain 12/03/16 1717    Raeford Razor, MD 12/04/16 1239

## 2016-12-03 NOTE — ED Notes (Signed)
Pt alert & oriented x4, stable gait. Patient  given discharge instructions, paperwork & prescription(s). Patient verbalized understanding. Pt left department w/ no further questions. 

## 2016-12-03 NOTE — ED Notes (Signed)
Pt was advised by wife of STD exposure. Per pt wife has been treated.

## 2016-12-04 LAB — RPR: RPR Ser Ql: NONREACTIVE

## 2016-12-04 LAB — GC/CHLAMYDIA PROBE AMP (~~LOC~~) NOT AT ARMC
CHLAMYDIA, DNA PROBE: POSITIVE — AB
Neisseria Gonorrhea: NEGATIVE

## 2016-12-04 LAB — HIV ANTIBODY (ROUTINE TESTING W REFLEX): HIV SCREEN 4TH GENERATION: NONREACTIVE

## 2016-12-08 ENCOUNTER — Other Ambulatory Visit (HOSPITAL_COMMUNITY)
Admission: RE | Admit: 2016-12-08 | Discharge: 2016-12-08 | Disposition: A | Discharge status: Home / Self Care | Payer: Medicaid Other | Source: Ambulatory Visit | Attending: Family Medicine | Admitting: Family Medicine

## 2016-12-08 ENCOUNTER — Other Ambulatory Visit: Payer: Self-pay | Admitting: Family Medicine

## 2016-12-08 DIAGNOSIS — Z113 Encounter for screening for infections with a predominantly sexual mode of transmission: Secondary | ICD-10-CM | POA: Insufficient documentation

## 2016-12-11 LAB — URINE CYTOLOGY ANCILLARY ONLY
BACTERIAL VAGINITIS: NEGATIVE
Candida vaginitis: NEGATIVE
Chlamydia: NEGATIVE
Neisseria Gonorrhea: NEGATIVE
TRICH (WINDOWPATH): NEGATIVE

## 2017-04-13 ENCOUNTER — Other Ambulatory Visit: Payer: Self-pay

## 2017-04-13 ENCOUNTER — Encounter (HOSPITAL_COMMUNITY): Payer: Self-pay | Admitting: Emergency Medicine

## 2017-04-13 ENCOUNTER — Emergency Department (HOSPITAL_COMMUNITY)
Admission: EM | Admit: 2017-04-13 | Discharge: 2017-04-14 | Disposition: A | Discharge status: Home / Self Care | Payer: Medicaid Other | Attending: Emergency Medicine | Admitting: Emergency Medicine

## 2017-04-13 DIAGNOSIS — N342 Other urethritis: Secondary | ICD-10-CM | POA: Insufficient documentation

## 2017-04-13 MED ORDER — AZITHROMYCIN 250 MG PO TABS
1000.0000 mg | ORAL_TABLET | Freq: Once | ORAL | Status: AC
Start: 2017-04-13 — End: 2017-04-13
  Administered 2017-04-13: 1000 mg via ORAL
  Filled 2017-04-13: qty 4

## 2017-04-13 MED ORDER — LIDOCAINE HCL (PF) 1 % IJ SOLN
INTRAMUSCULAR | Status: AC
  Administered 2017-04-13: 0.9 mL
  Filled 2017-04-13: qty 5

## 2017-04-13 MED ORDER — CEFTRIAXONE SODIUM 250 MG IJ SOLR
250.0000 mg | Freq: Once | INTRAMUSCULAR | Status: AC
  Administered 2017-04-13: 250 mg via INTRAMUSCULAR
  Filled 2017-04-13: qty 250

## 2017-04-13 NOTE — ED Triage Notes (Signed)
Pt states he is having a burning sensation at the head of his penis.

## 2017-04-13 NOTE — ED Provider Notes (Signed)
[facility]GENCY DEPARTMENT Provider Note   CSN: [id number] Arrival date & time: 04/13/17  2249     History   Chief Complaint Chief Complaint  Patient presents with  . SEXUALLY TRANSMITTED DISEASE    HPI Eddie [name] is a 27 y.o. male.  The history is provided by the patient. No language interpreter was used.  Penile Discharge  This is a new problem. The current episode started yesterday. The problem occurs constantly. The problem has been gradually worsening. Nothing aggravates the symptoms. Nothing relieves the symptoms. He has tried nothing for the symptoms. The treatment provided no relief.  Pt reports he was treated for an std a month ago.  Pt thinks he has again  Past Medical History:  Diagnosis Date  . Seizures (HCC)    last one 2013    Patient Active Problem List   Diagnosis Date Noted  . Inguinal hernia with incarceration 08/21/2013  . 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 INGUINAL HERNIA REPAIR  08/21/2013  . INGUINAL HERNIA REPAIR Left 08/21/2013   Procedure: LEFT HERNIA REPAIR INGUINAL ADULT;  Surgeon: Marlane HatcherWilliam S Bradford, MD;  Location: [facility];  Service: General;  Laterality: Left;       Home Medications    Prior to Admission medications   Medication Sig Start Date End Date Taking? Authorizing Provider  docusate sodium 100 MG CAPS Take 100 mg by mouth 2 (two) times daily. Patient not taking: Reported on 05/09/2014 08/22/13   Barbaraann BarthelBradford, William, MD  levETIRAcetam (KEPPRA) 500 MG tablet Take 1 tablet (500 mg total) by mouth 2 (two) times daily. 03/03/15   Devoria AlbeKnapp, Iva, MD  oxyCODONE-acetaminophen (PERCOCET) 10-325 MG per tablet Take 1 tablet by mouth every 4 (four) hours as needed for pain. Patient not taking: Reported on 05/09/2014 08/22/13   Barbaraann BarthelBradford, William, MD    Family History Family History  Problem Relation Age of Onset  . Headache Mother   . Seizures Father   .  Hypertension Father     Social History Social History   Tobacco Use  . Smoking status: Never Smoker  . Smokeless tobacco: Never Used  Substance Use Topics  . Alcohol use: No  . Drug use: No     Allergies   Dilantin [phenytoin sodium extended] and Divalproex sodium   Review of Systems Review of Systems  Genitourinary: Positive for discharge.  All other systems reviewed and are negative.    Physical Exam Updated Vital Signs BP (!) 145/82 (BP Location: Right Arm)   Pulse 67   Temp 98.5 F (36.9 C) (Oral)   Resp 15   Wt 81.6 kg (180 lb)   SpO2 99%   BMI 25.83 kg/m   Physical Exam  Constitutional: He appears well-developed and well-nourished.  HENT:  Head: Normocephalic and atraumatic.  Eyes: Conjunctivae are normal.  Neck: Neck supple.  Cardiovascular: Normal rate.  No murmur heard. Pulmonary/Chest: Effort normal. No respiratory distress.  Abdominal: There is no tenderness.  Genitourinary: Penis normal. No penile tenderness.  Musculoskeletal: He exhibits no edema.  Neurological: He is alert.  Skin: Skin is warm and dry.  Psychiatric: He has a normal mood and affect.  Nursing note and vitals reviewed.    ED Treatments / Results  Labs (all labs ordered are listed, but only abnormal results are displayed) Labs Reviewed - No data to display  EKG  EKG Interpretation None     MDM:   I  suspect pt has reexposure to std.  uretheral swab obtained. Pt given rocephin and zithromax here.  Pt given information on safe sex.  He declined hiv or syphilis testing  Radiology No results found.  Procedures Procedures (including critical care time)  Medications Ordered in ED Medications  cefTRIAXone (ROCEPHIN) injection 250 mg (not administered)  azithromycin (ZITHROMAX) tablet 1,000 mg (not administered)     Initial Impression / Assessment and Plan / ED Course  I have reviewed the triage vital signs and the nursing notes.  Pertinent labs & imaging results  that were available during my care of the patient were reviewed by me and considered in my medical decision making (see chart for details).       Final Clinical Impressions(s) / ED Diagnoses   Final diagnoses:  Urethritis    ED Discharge Orders    None    An After Visit Summary was printed and given to the patient.   Elson Areas, PA-C 04/13/17 2332    Samuel Jester, DO 04/14/17 1712

## 2017-04-15 LAB — GC/CHLAMYDIA PROBE AMP (~~LOC~~) NOT AT ARMC
CHLAMYDIA, DNA PROBE: NEGATIVE
NEISSERIA GONORRHEA: NEGATIVE

## 2017-04-23 ENCOUNTER — Encounter (HOSPITAL_COMMUNITY): Payer: Self-pay | Admitting: Emergency Medicine

## 2017-04-23 ENCOUNTER — Emergency Department (HOSPITAL_COMMUNITY)
Admission: EM | Admit: 2017-04-23 | Discharge: 2017-04-23 | Disposition: A | Discharge status: Home / Self Care | Payer: Medicaid Other | Attending: Emergency Medicine | Admitting: Emergency Medicine

## 2017-04-23 ENCOUNTER — Other Ambulatory Visit: Payer: Self-pay

## 2017-04-23 DIAGNOSIS — R569 Unspecified convulsions: Secondary | ICD-10-CM | POA: Insufficient documentation

## 2017-04-23 LAB — CBC WITH DIFFERENTIAL/PLATELET
Basophils Absolute: 0 10*3/uL (ref 0.0–0.1)
Basophils Relative: 0 %
EOS ABS: 0.2 10*3/uL (ref 0.0–0.7)
EOS PCT: 6 %
HCT: 41.2 % (ref 39.0–52.0)
Hemoglobin: 13.8 g/dL (ref 13.0–17.0)
LYMPHS ABS: 1.1 10*3/uL (ref 0.7–4.0)
LYMPHS PCT: 26 %
MCH: 28 pg (ref 26.0–34.0)
MCHC: 33.5 g/dL (ref 30.0–36.0)
MCV: 83.6 fL (ref 78.0–100.0)
MONO ABS: 0.4 10*3/uL (ref 0.1–1.0)
Monocytes Relative: 8 %
Neutro Abs: 2.6 10*3/uL (ref 1.7–7.7)
Neutrophils Relative %: 60 %
PLATELETS: 208 10*3/uL (ref 150–400)
RBC: 4.93 MIL/uL (ref 4.22–5.81)
RDW: 13.2 % (ref 11.5–15.5)
WBC: 4.4 10*3/uL (ref 4.0–10.5)

## 2017-04-23 LAB — COMPREHENSIVE METABOLIC PANEL
ALK PHOS: 63 U/L (ref 38–126)
ALT: 42 U/L (ref 17–63)
AST: 32 U/L (ref 15–41)
Albumin: 4.4 g/dL (ref 3.5–5.0)
Anion gap: 10 (ref 5–15)
BUN: 11 mg/dL (ref 6–20)
CHLORIDE: 104 mmol/L (ref 101–111)
CO2: 27 mmol/L (ref 22–32)
CREATININE: 1.08 mg/dL (ref 0.61–1.24)
Calcium: 9.3 mg/dL (ref 8.9–10.3)
Glucose, Bld: 103 mg/dL — ABNORMAL HIGH (ref 65–99)
Potassium: 4.2 mmol/L (ref 3.5–5.1)
Sodium: 141 mmol/L (ref 135–145)
Total Bilirubin: 0.7 mg/dL (ref 0.3–1.2)
Total Protein: 8 g/dL (ref 6.5–8.1)

## 2017-04-23 LAB — ETHANOL: ALCOHOL ETHYL (B): 79 mg/dL — AB (ref <10)

## 2017-04-23 MED ORDER — SODIUM CHLORIDE 0.9 % IV BOLUS (SEPSIS)
1000.0000 mL | Freq: Once | INTRAVENOUS | Status: AC
  Administered 2017-04-23: 1000 mL via INTRAVENOUS

## 2017-04-23 MED ORDER — ONDANSETRON HCL 4 MG/2ML IJ SOLN
4.0000 mg | Freq: Once | INTRAMUSCULAR | Status: AC
  Administered 2017-04-23: 4 mg via INTRAVENOUS
  Filled 2017-04-23: qty 2

## 2017-04-23 MED ORDER — LEVETIRACETAM IN NACL 1000 MG/100ML IV SOLN
1000.0000 mg | Freq: Once | INTRAVENOUS | Status: AC
  Administered 2017-04-23: 1000 mg via INTRAVENOUS
  Filled 2017-04-23: qty 100

## 2017-04-23 MED ORDER — LEVETIRACETAM 500 MG PO TABS: 500.0000 mg | ORAL_TABLET | Freq: Two times a day (BID) | ORAL | 0 refills | Status: DC

## 2017-04-23 NOTE — ED Triage Notes (Signed)
Per EMS: family states pt had a seizure that lasted an unknown amount of time. States pt has been drinking Personal assistanttequila tonight. PT does not take any medications for seizures.

## 2017-04-23 NOTE — ED Notes (Signed)
Pt wheeled to wife's car. Pt verbalized understanding of discharge instructions.

## 2017-04-23 NOTE — ED Provider Notes (Signed)
[facility] EMERGENCY DEPARTMENT Provider Note   CSN: [id number] Arrival date & time: 04/23/17  0033     History   Chief Complaint Chief Complaint  Patient presents with  . Seizures    HPI Eddie [name] is a 27 y.o. male.  The history is provided by the patient.  He has a history of seizure disorder and was brought in by ambulance tonight after having had a seizure.  He had been celebrating his birthday and had had at least 8 shots of Tequila when he had a generalized seizure.  Witnesses estimate seizure lasting 5-8 minutes followed by a postictal state where he seemed to stop breathing.  He had been on levetiracetam for his seizures, but his primary care provider had told him to stop taking it because he had not had a seizure in some time.  He thinks it is been several years since he had taken levetiracetam.  He denies any drug use.  He denies bit lip or tongue.  He denies bowel or bladder incontinence.  Past Medical History:  Diagnosis Date  . Seizures (HCC)    last one 2013    Patient Active Problem List   Diagnosis Date Noted  . Inguinal hernia with incarceration 08/21/2013  . 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 INGUINAL HERNIA REPAIR  08/21/2013  . INGUINAL HERNIA REPAIR Left 08/21/2013   Procedure: LEFT HERNIA REPAIR INGUINAL ADULT;  Surgeon: Marlane HatcherWilliam S Bradford, MD;  Location: [facility];  Service: General;  Laterality: Left;       Home Medications    Prior to Admission medications   Medication Sig Start Date End Date Taking? Authorizing Provider  docusate sodium 100 MG CAPS Take 100 mg by mouth 2 (two) times daily. Patient not taking: Reported on 05/09/2014 08/22/13   Barbaraann BarthelBradford, William, MD  levETIRAcetam (KEPPRA) 500 MG tablet Take 1 tablet (500 mg total) by mouth 2 (two) times daily. 03/03/15   Devoria AlbeKnapp, Iva, MD  oxyCODONE-acetaminophen (PERCOCET) 10-325 MG per tablet Take 1 tablet by mouth  every 4 (four) hours as needed for pain. Patient not taking: Reported on 05/09/2014 08/22/13   Barbaraann BarthelBradford, William, MD    Family History Family History  Problem Relation Age of Onset  . Headache Mother   . Seizures Father   . Hypertension Father     Social History Social History   Tobacco Use  . Smoking status: Never Smoker  . Smokeless tobacco: Never Used  Substance Use Topics  . Alcohol use: No  . Drug use: No     Allergies   Dilantin [phenytoin sodium extended]; Tegretol [carbamazepine]; and Divalproex sodium   Review of Systems Review of Systems  All other systems reviewed and are negative.    Physical Exam Updated Vital Signs BP (!) 151/87 (BP Location: Left Arm)   Pulse 83   Temp 98.1 F (36.7 C)   Resp 16   Ht 6' (1.829 m)   Wt 81.6 kg (180 lb)   SpO2 97%   BMI 24.41 kg/m   Physical Exam  Nursing note and vitals reviewed.  27 year old male, resting comfortably and in no acute distress. Vital signs are significant for mildly elevated systolic blood pressure. Oxygen saturation is 97%, which is normal. Head is normocephalic and atraumatic. PERRLA, EOMI. Oropharynx is clear. Neck is nontender and supple without adenopathy or JVD. Back is nontender and there is no CVA tenderness. Lungs are clear without  rales, wheezes, or rhonchi. Chest is nontender. Heart has regular rate and rhythm without murmur. Abdomen is soft, flat, nontender without masses or hepatosplenomegaly and peristalsis is normoactive. Extremities have no cyanosis or edema, full range of motion is present. Skin is warm and dry without rash. Neurologic: Clinically intoxicated, but otherwise mental status is normal, cranial nerves are intact, there are no motor or sensory deficits.  ED Treatments / Results  Labs (all labs ordered are listed, but only abnormal results are displayed) Labs Reviewed  COMPREHENSIVE METABOLIC PANEL - Abnormal; Notable for the following components:      Result  Value   Glucose, Bld 103 (*)    All other components within normal limits  ETHANOL - Abnormal; Notable for the following components:   Alcohol, Ethyl (B) 79 (*)    All other components within normal limits  CBC WITH DIFFERENTIAL/PLATELET    EKG  EKG Interpretation  Date/Time:  Friday April 23 2017 00:38:47 EST Ventricular Rate:  76 PR Interval:    QRS Duration: 93 QT Interval:  352 QTC Calculation: 396 R Axis:   78 Text Interpretation:  Sinus rhythm Normal ECG When compared with ECG of 05/26/2015, HEART RATE has increased Confirmed by Dione Booze ([id number]) on 04/23/2017 12:50:31 AM       Procedures Procedures  Medications Ordered in ED Medications  ondansetron (ZOFRAN) injection 4 mg (4 mg Intravenous Given 04/23/17 0059)  sodium chloride 0.9 % bolus 1,000 mL (1,000 mLs Intravenous New Bag/Given 04/23/17 0112)  levETIRAcetam (KEPPRA) IVPB 1000 mg/100 mL premix (0 mg Intravenous Stopped 04/23/17 0138)     Initial Impression / Assessment and Plan / ED Course  I have reviewed the triage vital signs and the nursing notes.  Pertinent labs & imaging results that were available during my care of the patient were reviewed by me and considered in my medical decision making (see chart for details).  Seizure in patient with known seizure disorder.  Alcohol intoxication.  Old records are reviewed, and he has several ED visits for seizure with the most recent being March 02, 2015 and levetiracetam was prescribed at that time.  He will be given a loading dose of levetiracetam.  Laboratory workup is remarkable only for ethanol level of 79, which is below the legal limit for intoxication.  He is discharged with prescription for levetiracetam and referred to neurology.  Advised that he should not discontinue anticonvulsant medications unless directed to stop by a neurologist.  Final Clinical Impressions(s) / ED Diagnoses   Final diagnoses:  Seizure [facility])    ED Discharge Orders         Ordered    levETIRAcetam (KEPPRA) 500 MG tablet  2 times daily     04/23/17 0227       Dione Booze, MD 04/23/17 0230

## 2017-04-23 NOTE — Discharge Instructions (Signed)
You need to go back to taking Keppra twice a day, every day.  Do not stop taking it unless directed to do so by a neurologist.  [facility] [place] law states you may not drive until you have gone 6 months without a seizure.

## 2017-06-04 ENCOUNTER — Emergency Department (HOSPITAL_COMMUNITY)
Admission: EM | Admit: 2017-06-04 | Discharge: 2017-06-04 | Disposition: A | Discharge status: Home / Self Care | Payer: Self-pay | Attending: Emergency Medicine | Admitting: Emergency Medicine

## 2017-06-04 ENCOUNTER — Encounter (HOSPITAL_COMMUNITY): Payer: Self-pay | Admitting: Emergency Medicine

## 2017-06-04 ENCOUNTER — Other Ambulatory Visit: Payer: Self-pay

## 2017-06-04 ENCOUNTER — Emergency Department (HOSPITAL_COMMUNITY): Payer: Self-pay

## 2017-06-04 DIAGNOSIS — W3189XA Contact with other specified machinery, initial encounter: Secondary | ICD-10-CM | POA: Insufficient documentation

## 2017-06-04 DIAGNOSIS — Y9389 Activity, other specified: Secondary | ICD-10-CM | POA: Insufficient documentation

## 2017-06-04 DIAGNOSIS — S61210A Laceration without foreign body of right index finger without damage to nail, initial encounter: Secondary | ICD-10-CM | POA: Insufficient documentation

## 2017-06-04 DIAGNOSIS — Y9289 Other specified places as the place of occurrence of the external cause: Secondary | ICD-10-CM | POA: Insufficient documentation

## 2017-06-04 DIAGNOSIS — Y999 Unspecified external cause status: Secondary | ICD-10-CM | POA: Insufficient documentation

## 2017-06-04 DIAGNOSIS — Z79899 Other long term (current) drug therapy: Secondary | ICD-10-CM | POA: Insufficient documentation

## 2017-06-04 DIAGNOSIS — Z23 Encounter for immunization: Secondary | ICD-10-CM | POA: Insufficient documentation

## 2017-06-04 MED ORDER — TETANUS-DIPHTH-ACELL PERTUSSIS 5-2.5-18.5 LF-MCG/0.5 IM SUSP
0.5000 mL | Freq: Once | INTRAMUSCULAR | Status: AC
  Administered 2017-06-04: 0.5 mL via INTRAMUSCULAR
  Filled 2017-06-04: qty 0.5

## 2017-06-04 MED ORDER — LIDOCAINE HCL (PF) 2 % IJ SOLN
INTRAMUSCULAR | Status: AC
  Filled 2017-06-04: qty 10

## 2017-06-04 MED ORDER — POVIDONE-IODINE 10 % EX SOLN
CUTANEOUS | Status: AC
  Filled 2017-06-04: qty 15

## 2017-06-04 MED ORDER — TRAMADOL HCL 50 MG PO TABS: 50.0000 mg | ORAL_TABLET | Freq: Four times a day (QID) | ORAL | 0 refills | Status: DC | PRN

## 2017-06-04 NOTE — Discharge Instructions (Addendum)
Have your sutures removed in 10 days.  Keep your wound clean and dry,  Until a good scab forms - you may then wash gently twice daily with mild soap and water, but dry completely after.  Get rechecked for any sign of infection (redness,  Swelling,  Increased pain or drainage of purulent fluid).  Wear the finger splint for the next 2-3 days to help prevent stretching of the wound edges as this heals.

## 2017-06-04 NOTE — ED Provider Notes (Addendum)
[facility] Provider [facility]   CSN: [id number] Arrival date & time: 06/04/17  1937     History   Chief Complaint Chief Complaint  Patient presents with  . Laceration    HPI Eddie MangesJonathan L [name] is a 27 y.o. male.  The history is provided by the patient.  Laceration   The incident occurred 1 to 2 hours ago. The laceration is located on the right hand. The laceration is 2 cm in size. Injury mechanism: caught on grinder while working on a car. The pain is at a severity of 8/10. The pain is moderate. The pain has been constant since onset. It is unknown if a foreign body is present. His tetanus status is out of date.    Past Medical History:  Diagnosis Date  . Seizures (HCC)    last one 2013    Patient Active Problem List   Diagnosis Date Noted  . Inguinal hernia with incarceration 08/21/2013  . 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 INGUINAL HERNIA REPAIR  08/21/2013  . INGUINAL HERNIA REPAIR Left 08/21/2013   Procedure: LEFT HERNIA REPAIR INGUINAL ADULT;  Surgeon: Marlane HatcherWilliam S Bradford, MD;  Location: [facility];  Service: General;  Laterality: Left;        Home Medications    Prior to Admission medications   Medication Sig Start Date End Date Taking? Authorizing Provider  levETIRAcetam (KEPPRA) 500 MG tablet Take 1 tablet (500 mg total) by mouth 2 (two) times daily. 04/23/17   Dione BoozeGlick, David, MD    Family History Family History  Problem Relation Age of Onset  . Headache Mother   . Seizures Father   . Hypertension Father     Social History Social History   Tobacco Use  . Smoking status: Never Smoker  . Smokeless tobacco: Never Used  Substance Use Topics  . Alcohol use: No  . Drug use: No     Allergies   Dilantin [phenytoin sodium extended]; Tegretol [carbamazepine]; and Divalproex sodium   Review of Systems Review of Systems  Constitutional: Negative for chills and  fever.  Musculoskeletal: Positive for arthralgias.  Skin: Positive for wound.  Neurological: Negative for weakness and numbness.     Physical Exam Updated Vital Signs BP (!) 149/93   Pulse 91   Temp 98.2 F (36.8 C)   Resp 20   Ht 5\' 9"  (1.753 m)   Wt 89.8 kg (198 lb)   SpO2 99%   BMI 29.24 kg/m   Physical Exam  Constitutional: He is oriented to person, place, and time. He appears well-developed and well-nourished.  HENT:  Head: Normocephalic.  Cardiovascular: Normal rate.  Pulmonary/Chest: Effort normal.  Musculoskeletal: He exhibits tenderness.       Hands: Neurological: He is alert and oriented to person, place, and time. No sensory deficit.  Skin: Laceration noted.  Laceration right dorsal index finger over pip joint.  Hemostatic.  Distal sensation intact.      ED Treatments / Results  Labs (all labs ordered are listed, but only abnormal results are displayed) Labs Reviewed - No data to display  EKG None  Radiology Dg Finger Index Right  Result Date: 06/04/2017 CLINICAL DATA:  Right index finger laceration EXAM: RIGHT INDEX FINGER 2+V COMPARISON:  None. FINDINGS: There is no evidence of fracture or dislocation. There is no evidence of arthropathy or other focal bone abnormality. No radiopaque foreign body. IMPRESSION: No radiopaque foreign body or acute  osseous injury of the right index finger. Electronically Signed   By: Deatra Robinson M.D.   On: 06/04/2017 21:00    Procedures Procedures (including critical care time)  LACERATION REPAIR Performed by: Burgess Amor Authorized by: Burgess Amor Consent: Verbal consent obtained. Risks and benefits: risks, benefits and alternatives were discussed Consent given by: patient Patient identity confirmed: provided demographic data Prepped and Draped in normal sterile fashion Wound explored  Laceration Location: right index finger  Laceration Length: 1cm  No Foreign Bodies seen or palpated  Anesthesia: digital  block  Local anesthetic: lidocaine 2% without epinephrine  Anesthetic total: 2 ml  Irrigation method: syringe Amount of cleaning: standard  Skin closure: ethilon 4-0  Number of sutures: 4  Technique: simple interupted  Patient tolerance: Patient tolerated the procedure well with no immediate complications.   Medications Ordered in ED Medications  lidocaine (XYLOCAINE) 2 % injection (has no administration in time range)  povidone-iodine (BETADINE) 10 % external solution (has no administration in time range)  Tdap (BOOSTRIX) injection 0.5 mL (has no administration in time range)     Initial Impression / Assessment and Plan / ED Course  I have reviewed the triage vital signs and the nursing notes.  Pertinent labs & imaging results that were available during my care of the patient were reviewed by me and considered in my medical decision making (see chart for details).     Tetanus updated. Finger splint applied to immobilize wound. Wound care instructions given.  Pt advised to have sutures removed in 10 days,  Return here sooner for any signs of infection including redness, swelling, worse pain or drainage of pus.     Final Clinical Impressions(s) / ED Diagnoses   Final diagnoses:  None    ED Discharge Orders    None       Eddie [name] 06/04/17 2157    Burgess Amor, PA-C 06/04/17 2157    Long, Arlyss Repress, MD 06/05/17 [phone]

## 2017-06-04 NOTE — ED Triage Notes (Signed)
Pt cut right index finger with grinder today. Pt states the cut is over the knuckle. Dressing is dry and intact.

## 2017-06-04 NOTE — ED Notes (Signed)
Pt ambulatory to waiting room. Pt verbalized understanding of discharge instructions.   

## 2019-12-07 ENCOUNTER — Ambulatory Visit: Payer: Medicaid Other

## 2020-03-28 ENCOUNTER — Other Ambulatory Visit: Payer: Medicaid Other

## 2020-03-28 DIAGNOSIS — Z20822 Contact with and (suspected) exposure to covid-19: Secondary | ICD-10-CM

## 2020-03-30 LAB — SARS-COV-2, NAA 2 DAY TAT

## 2020-03-30 LAB — NOVEL CORONAVIRUS, NAA: SARS-CoV-2, NAA: NOT DETECTED

## 2021-03-02 ENCOUNTER — Emergency Department (HOSPITAL_COMMUNITY): Payer: Self-pay

## 2021-03-02 ENCOUNTER — Inpatient Hospital Stay (HOSPITAL_COMMUNITY)
Admission: EM | Admit: 2021-03-02 | Discharge: 2021-03-03 | DRG: 100 | Disposition: A | Discharge status: Home / Self Care | Payer: Self-pay | Attending: Family Medicine | Admitting: Family Medicine

## 2021-03-02 ENCOUNTER — Encounter (HOSPITAL_COMMUNITY): Payer: Self-pay

## 2021-03-02 ENCOUNTER — Other Ambulatory Visit: Payer: Self-pay

## 2021-03-02 DIAGNOSIS — Z28310 Unvaccinated for covid-19: Secondary | ICD-10-CM

## 2021-03-02 DIAGNOSIS — Z8249 Family history of ischemic heart disease and other diseases of the circulatory system: Secondary | ICD-10-CM

## 2021-03-02 DIAGNOSIS — R569 Unspecified convulsions: Secondary | ICD-10-CM

## 2021-03-02 DIAGNOSIS — G9341 Metabolic encephalopathy: Secondary | ICD-10-CM | POA: Diagnosis present

## 2021-03-02 DIAGNOSIS — Z888 Allergy status to other drugs, medicaments and biological substances status: Secondary | ICD-10-CM

## 2021-03-02 DIAGNOSIS — R4182 Altered mental status, unspecified: Secondary | ICD-10-CM

## 2021-03-02 DIAGNOSIS — Z20822 Contact with and (suspected) exposure to covid-19: Secondary | ICD-10-CM | POA: Diagnosis present

## 2021-03-02 DIAGNOSIS — G40909 Epilepsy, unspecified, not intractable, without status epilepticus: Principal | ICD-10-CM | POA: Diagnosis present

## 2021-03-02 DIAGNOSIS — Z820 Family history of epilepsy and other diseases of the nervous system: Secondary | ICD-10-CM

## 2021-03-02 DIAGNOSIS — F17290 Nicotine dependence, other tobacco product, uncomplicated: Secondary | ICD-10-CM | POA: Diagnosis present

## 2021-03-02 LAB — CBC WITH DIFFERENTIAL/PLATELET
Abs Immature Granulocytes: 0.03 10*3/uL (ref 0.00–0.07)
Basophils Absolute: 0 10*3/uL (ref 0.0–0.1)
Basophils Relative: 0 %
Eosinophils Absolute: 0.1 10*3/uL (ref 0.0–0.5)
Eosinophils Relative: 1 %
HCT: 40.2 % (ref 39.0–52.0)
Hemoglobin: 13.7 g/dL (ref 13.0–17.0)
Immature Granulocytes: 0 %
Lymphocytes Relative: 11 %
Lymphs Abs: 1.2 10*3/uL (ref 0.7–4.0)
MCH: 29 pg (ref 26.0–34.0)
MCHC: 34.1 g/dL (ref 30.0–36.0)
MCV: 85.2 fL (ref 80.0–100.0)
Monocytes Absolute: 0.8 10*3/uL (ref 0.1–1.0)
Monocytes Relative: 8 %
Neutro Abs: 8.1 10*3/uL — ABNORMAL HIGH (ref 1.7–7.7)
Neutrophils Relative %: 80 %
Platelets: 208 10*3/uL (ref 150–400)
RBC: 4.72 MIL/uL (ref 4.22–5.81)
RDW: 12.6 % (ref 11.5–15.5)
WBC: 10.2 10*3/uL (ref 4.0–10.5)
nRBC: 0 % (ref 0.0–0.2)

## 2021-03-02 LAB — COMPREHENSIVE METABOLIC PANEL
ALT: 25 U/L (ref 0–44)
AST: 23 U/L (ref 15–41)
Albumin: 4.5 g/dL (ref 3.5–5.0)
Alkaline Phosphatase: 62 U/L (ref 38–126)
Anion gap: 13 (ref 5–15)
BUN: 15 mg/dL (ref 6–20)
CO2: 21 mmol/L — ABNORMAL LOW (ref 22–32)
Calcium: 8.7 mg/dL — ABNORMAL LOW (ref 8.9–10.3)
Chloride: 102 mmol/L (ref 98–111)
Creatinine, Ser: 0.99 mg/dL (ref 0.61–1.24)
GFR, Estimated: >60 mL/min (ref >60)
Glucose, Bld: 144 mg/dL — ABNORMAL HIGH (ref 70–99)
Potassium: 3.6 mmol/L (ref 3.5–5.1)
Sodium: 136 mmol/L (ref 135–145)
Total Bilirubin: 0.5 mg/dL (ref 0.3–1.2)
Total Protein: 7.8 g/dL (ref 6.5–8.1)

## 2021-03-02 MED ORDER — LEVETIRACETAM IN NACL 1500 MG/100ML IV SOLN
1500.0000 mg | Freq: Once | INTRAVENOUS | Status: AC
  Administered 2021-03-02: 21:00:00 1500 mg via INTRAVENOUS
  Filled 2021-03-02: qty 100

## 2021-03-02 MED ORDER — LEVETIRACETAM 500 MG PO TABS: 500.0000 mg | ORAL_TABLET | Freq: Two times a day (BID) | ORAL | 2 refills | Status: DC

## 2021-03-02 NOTE — ED Provider Notes (Signed)
Care assumed from Dr. Hyacinth Meeker, patient with altered mental status, probably post-ictal.  He has received a loading dose of levetiracetam.  Will need to observe for return to normal mental status.  He is observed in the emergency department for 4 hours, without return to normal mental status.  He is still somnolent and almost impossible to arouse.  Decision is made to admit, may need neurology consult.  Case is discussed with Dr. Carren Rang of [facility], who agrees to admit the patient.   Dione Booze, MD 03/03/21 [phone]

## 2021-03-02 NOTE — ED Triage Notes (Signed)
Pt was found by family unresponsive, no meds given to patient pta. Will withdraw from painfull stimuli

## 2021-03-02 NOTE — ED Provider Notes (Signed)
[facility] EMERGENCY DEPARTMENT Provider Note   CSN: [id number] Arrival date & time: 03/02/21  2033     History Chief Complaint  Patient presents with   Seizures    Eddie [name] is a 30 y.o. male.   Seizures  This patient is a 30 year old male who has a reported history of seizures, evidently he has not been taking his medications according to the girlfriend.  He was found unresponsive in his house, he was brought to the couch, paramedics were called due to his level of unresponsiveness.  Nobody saw any witnessed seizure activity and the patient is completely altered and not able to answer any questions.  Paramedics reported normal vital signs including his respiratory rate, a blood sugar that was just over 100 and they did not give any medications prehospital, no seizure activity was seen on the ambulance.  Level 5 caveat applies as the patient is not able to give me any information.  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    Past Surgical History:  Procedure Laterality Date   INGUINAL HERNIA REPAIR  08/21/2013   INGUINAL HERNIA REPAIR Left 08/21/2013   Procedure: LEFT HERNIA REPAIR INGUINAL ADULT;  Surgeon: Marlane Hatcher, MD;  Location: [facility];  Service: General;  Laterality: Left;       Family History  Problem Relation Age of Onset   Headache Mother    Seizures Father    Hypertension Father     Social History   Tobacco Use   Smoking status: Never   Smokeless tobacco: Never  Substance Use Topics   Alcohol use: No   Drug use: No    Home Medications Prior to Admission medications   Medication Sig Start Date End Date Taking? Authorizing Provider  levETIRAcetam (KEPPRA) 500 MG tablet Take 1 tablet (500 mg total) by mouth 2 (two) times daily. 03/02/21   Eber Hong, MD  traMADol (ULTRAM) 50 MG tablet Take 1 tablet (50 mg total) by mouth every 6 (six) hours as needed. 06/04/17   Burgess Amor, PA-C    Allergies    Dilantin [phenytoin sodium extended], Tegretol [carbamazepine], and Divalproex sodium  Review of Systems   Review of Systems  Unable to perform ROS: Mental status change   Physical Exam Updated Vital Signs BP 124/65    Pulse 79    Temp 97.9 F (36.6 C) (Oral)    Resp 15    Ht 1.727 m (5\' 8" )    Wt 79.4 kg    SpO2 96%    BMI 26.61 kg/m   Physical Exam Vitals and nursing note reviewed.  Constitutional:      General: He is not in acute distress.    Appearance: He is well-developed.     Comments: Obtunded - responds somewhat to painful stimuli  HENT:     Head: Normocephalic and atraumatic.     Nose: Nose normal. No congestion or rhinorrhea.     Mouth/Throat:     Pharynx: No oropharyngeal exudate.     Comments: Mouth is clenched, unable to see the patient's tongue or lips Eyes:     General: No scleral icterus.       Right eye: No discharge.        Left eye: No discharge.  Conjunctiva/sclera: Conjunctivae normal.     Pupils: Pupils are equal, round, and reactive to light.  Neck:     Thyroid: No thyromegaly.     Vascular: No JVD.  Cardiovascular:     Rate and Rhythm: Normal rate and regular rhythm.     Heart sounds: Normal heart sounds. No murmur heard.   No friction rub. No gallop.  Pulmonary:     Effort: Pulmonary effort is normal. No respiratory distress.     Breath sounds: Normal breath sounds. No wheezing or rales.  Abdominal:     General: Bowel sounds are normal. There is no distension.     Palpations: Abdomen is soft. There is no mass.     Tenderness: There is no abdominal tenderness.  Musculoskeletal:        General: No tenderness. Normal range of motion.     Cervical back: Normal range of motion and neck supple.  Lymphadenopathy:     Cervical: No cervical adenopathy.  Skin:    General: Skin is warm and  dry.     Findings: No erythema or rash.  Neurological:     Coordination: Coordination normal.     Comments: Initially the patient was not responding to anything except painful stimuli with a grimace, as the exam went on the patient started to have some very slight movements of both arms and both legs to command but still not waking up and not talking, there is no facial droop, no active seizure activity  Psychiatric:        Behavior: Behavior normal.    ED Results / Procedures / Treatments   Labs (all labs ordered are listed, but only abnormal results are displayed) Labs Reviewed  CBC WITH DIFFERENTIAL/PLATELET - Abnormal; Notable for the following components:      Result Value   Neutro Abs 8.1 (*)    All other components within normal limits  COMPREHENSIVE METABOLIC PANEL - Abnormal; Notable for the following components:   CO2 21 (*)    Glucose, Bld 144 (*)    Calcium 8.7 (*)    All other components within normal limits  CBG MONITORING, ED    EKG EKG Interpretation  Date/Time:  Sunday March 02 2021 20:44:26 EST Ventricular Rate:  89 PR Interval:  148 QRS Duration: 90 QT Interval:  359 QTC Calculation: 437 R Axis:   82 Text Interpretation: Sinus rhythm Confirmed by Eber Hong ([id number]) on 03/02/2021 9:23:46 PM  Radiology CT Head Wo Contrast  Result Date: 03/02/2021 CLINICAL DATA:  Found unresponsive, initial encounter EXAM: CT HEAD WITHOUT CONTRAST TECHNIQUE: Contiguous axial images were obtained from the base of the skull through the vertex without intravenous contrast. COMPARISON:  05/29/2009 FINDINGS: Brain: No evidence of acute infarction, hemorrhage, hydrocephalus, extra-axial collection or mass lesion/mass effect. Vascular: No hyperdense vessel or unexpected calcification. Skull: Normal. Negative for fracture or focal lesion. Sinuses/Orbits: No acute finding. Other: None. IMPRESSION: No acute intracranial abnormality noted. Electronically Signed   By: Alcide Clever  M.D.   On: 03/02/2021 22:46    Procedures Procedures   Medications Ordered in ED Medications  levETIRAcetam (KEPPRA) IVPB 1500 mg/ 100 mL premix (0 mg Intravenous Stopped 03/02/21 2146)    ED Course  I have reviewed the triage vital signs and the nursing notes.  Pertinent labs & imaging results that were available during my care of the patient were reviewed by me and considered in my medical decision making (see chart for details).    MDM Rules/Calculators/A&P  Due to the patient's history of seizures and his current status we will proceed with Keppra, in the medical record it shows that he had been prescribed several years ago.  Seizure precautions, recheck labs including CBG, metabolic panel, there is no signs of head injury, if he is not returning back to normal we may need a CT scan of the brain however this sounds consistent with postictal state  The patients family has arrived - found in the bathroom.  Placed on couch - no signs of seizures on exam or on history =- was not seen Now gradually improving but slowly, now opening his mouth and showing me his tongue but very slow to move, not answering questions, nonverbal at this time.  He is moving both of his legs.  Vital signs remain normal, lab work remains unremarkable, CT scan of the brain is been ordered due to the patient's prolonged altered mental status and postictal phase.  He has been given Keppra and has not had any further seizure activity  Change of shift - care signed out to Dr. Preston Fleeting to f/u results and disposition accordingly - the pt has not returned to baseline though he is slowly improving - CT head has been performed and I have personally viewed and interpreted this study, there is no acute findings to suggest trauma tumor or other abnormalities.  I agree with the radiology interpretation.  Labs of been unremarkable, no acute findings  The patient has been given Keppra, no further seizure  activity     Final Clinical Impression(s) / ED Diagnoses Final diagnoses:  Seizure [facility])    Rx / DC Orders ED Discharge Orders          Ordered    levETIRAcetam (KEPPRA) 500 MG tablet  2 times daily        03/02/21 2310             Eber Hong, MD 03/02/21 2312

## 2021-03-02 NOTE — ED Notes (Signed)
Pt is not able to be aroused to talk, will open eyes and shake head to answer questions.

## 2021-03-02 NOTE — Discharge Instructions (Addendum)
Seizure precautions: Per [facility] statutes, patients with seizures are not allowed to drive until they have been seizure-free for six months and cleared by a physician    Use caution when using heavy equipment or power tools. Avoid working on ladders or at heights. Take showers instead of baths. Ensure the water temperature is not too high on the home water heater. Do not go swimming alone. Do not lock yourself in a room alone (i.e. bathroom). When caring for infants or small children, sit down when holding, feeding, or changing them to minimize risk of injury to the child in the event you have a seizure. Maintain good sleep hygiene. Avoid alcohol.    If patient has another seizure, call 911 and bring them back to the ED if: A.  The seizure lasts longer than 5 minutes.      B.  The patient doesn't wake shortly after the seizure or has new problems such as difficulty seeing, speaking or moving following the seizure C.  The patient was injured during the seizure D.  The patient has a temperature over 102 F (39C) E.  The patient vomited during the seizure and now is having trouble breathing    During the Seizure   - First, ensure adequate ventilation and place patients on the floor on their left side  Loosen clothing around the neck and ensure the airway is patent. If the patient is clenching the teeth, do not force the mouth open with any object as this can cause severe damage - Remove all items from the surrounding that can be hazardous. The patient may be oblivious to what's happening and may not even know what he or she is doing. If the patient is confused and wandering, either gently guide him/her away and block access to outside areas - Reassure the individual and be comforting - Call 911. In most cases, the seizure ends before EMS arrives. However, there are cases when seizures may last over 3 to 5 minutes. Or the individual may have developed breathing difficulties or severe  injuries. If a pregnant patient or a person with diabetes develops a seizure, it is prudent to call an ambulance. - Finally, if the patient does not regain full consciousness, then call EMS. Most patients will remain confused for about 45 to 90 minutes after a seizure, so you must use judgment in calling for help. - Avoid restraints but make sure the patient is in a bed with padded side rails - Place the individual in a lateral position with the neck slightly flexed; this will help the saliva drain from the mouth and prevent the tongue from falling backward - Remove all nearby furniture and other hazards from the area - Provide verbal assurance as the individual is regaining consciousness - Provide the patient with privacy if possible - Call for help and start treatment as ordered by the caregiver    After the Seizure (Postictal Stage)   After a seizure, most patients experience confusion, fatigue, muscle pain and/or a headache. Thus, one should permit the individual to sleep. For the next few days, reassurance is essential. Being calm and helping reorient the person is also of importance.   Most seizures are painless and end spontaneously. Seizures are not harmful to others but can lead to complications such as stress on the lungs, brain and the heart. Individuals with prior lung problems may develop labored breathing and respiratory distress.    NO DRIVING OR OPERATING MACHINERY UNTIL YOU HAVE BEEN  CLEARED BY YOUR NEUROLOGIST  PLEASE HAVE YOUR NEUROLOGIST SCHEDULE AN MRI BRAIN ON OUTPATIENT BASIS    If you do not take the medication that has been prescribed you may continue to have seizures.  This can be very dangerous and it can even be life-threatening.  Please take Keppra, 1 tablet by mouth twice a day, if you continue to have seizures return to the emergency department.  Please see the phone number above for the neurologist in the community, call the office to make an appointment to be  seen within the next 2 weeks.  ER for worsening symptoms    IMPORTANT INFORMATION: PAY CLOSE ATTENTION   PHYSICIAN DISCHARGE INSTRUCTIONS  Follow with Primary care provider  Mirna Mires, MD  and other consultants as instructed by your Hospitalist Physician  SEEK MEDICAL CARE OR RETURN TO EMERGENCY ROOM IF SYMPTOMS COME BACK, WORSEN OR NEW PROBLEM DEVELOPS   Please note: You were cared for by a hospitalist during your hospital stay. Every effort will be made to forward records to your primary care provider.  You can request that your primary care provider send for your hospital records if they have not received them.  Once you are discharged, your primary care physician will handle any further medical issues. Please note that NO REFILLS for any discharge medications will be authorized once you are discharged, as it is imperative that you return to your primary care physician (or establish a relationship with a primary care physician if you do not have one) for your post hospital discharge needs so that they can reassess your need for medications and monitor your lab values.  Please get a complete blood count and chemistry panel checked by your Primary MD at your next visit, and again as instructed by your Primary MD.  Get Medicines reviewed and adjusted: Please take all your medications with you for your next visit with your Primary MD  Laboratory/radiological data: Please request your Primary MD to go over all hospital tests and procedure/radiological results at the follow up, please ask your primary care provider to get all Hospital records sent to his/her office.  In some cases, they will be blood work, cultures and biopsy results pending at the time of your discharge. Please request that your primary care provider follow up on these results.  If you are diabetic, please bring your blood sugar readings with you to your follow up appointment with primary care.    Please call and make  your follow up appointments as soon as possible.    Also Note the following: If you experience worsening of your admission symptoms, develop shortness of breath, life threatening emergency, suicidal or homicidal thoughts you must seek medical attention immediately by calling 911 or calling your MD immediately  if symptoms less severe.  You must read complete instructions/literature along with all the possible adverse reactions/side effects for all the Medicines you take and that have been prescribed to you. Take any new Medicines after you have completely understood and accpet all the possible adverse reactions/side effects.   Do not drive when taking Pain medications or sleeping medications (Benzodiazepines)  Do not take more than prescribed Pain, Sleep and Anxiety Medications. It is not advisable to combine anxiety,sleep and pain medications without talking with your primary care practitioner  Special Instructions: If you have smoked or chewed Tobacco  in the last 2 yrs please stop smoking, stop any regular Alcohol  and or any Recreational drug use.  Wear Seat belts while  driving.  Do not drive if taking any narcotic, mind altering or controlled substances or recreational drugs or alcohol.

## 2021-03-03 ENCOUNTER — Inpatient Hospital Stay (HOSPITAL_COMMUNITY)
Admit: 2021-03-03 | Discharge: 2021-03-03 | Disposition: A | Discharge status: Home / Self Care | Payer: Self-pay | Attending: Family Medicine | Admitting: Family Medicine

## 2021-03-03 DIAGNOSIS — G9341 Metabolic encephalopathy: Secondary | ICD-10-CM | POA: Diagnosis present

## 2021-03-03 DIAGNOSIS — R4182 Altered mental status, unspecified: Secondary | ICD-10-CM

## 2021-03-03 DIAGNOSIS — R569 Unspecified convulsions: Secondary | ICD-10-CM

## 2021-03-03 LAB — CBC WITH DIFFERENTIAL/PLATELET
Abs Immature Granulocytes: 0.03 10*3/uL (ref 0.00–0.07)
Basophils Absolute: 0 10*3/uL (ref 0.0–0.1)
Basophils Relative: 0 %
Eosinophils Absolute: 0 10*3/uL (ref 0.0–0.5)
Eosinophils Relative: 0 %
HCT: 36.4 % — ABNORMAL LOW (ref 39.0–52.0)
Hemoglobin: 12.2 g/dL — ABNORMAL LOW (ref 13.0–17.0)
Immature Granulocytes: 0 %
Lymphocytes Relative: 13 %
Lymphs Abs: 1.3 10*3/uL (ref 0.7–4.0)
MCH: 28.2 pg (ref 26.0–34.0)
MCHC: 33.5 g/dL (ref 30.0–36.0)
MCV: 84.3 fL (ref 80.0–100.0)
Monocytes Absolute: 0.6 10*3/uL (ref 0.1–1.0)
Monocytes Relative: 6 %
Neutro Abs: 8 10*3/uL — ABNORMAL HIGH (ref 1.7–7.7)
Neutrophils Relative %: 81 %
Platelets: 197 10*3/uL (ref 150–400)
RBC: 4.32 MIL/uL (ref 4.22–5.81)
RDW: 12.8 % (ref 11.5–15.5)
WBC: 9.9 10*3/uL (ref 4.0–10.5)
nRBC: 0 % (ref 0.0–0.2)

## 2021-03-03 LAB — URINALYSIS, ROUTINE W REFLEX MICROSCOPIC
Bilirubin Urine: NEGATIVE
Glucose, UA: NEGATIVE mg/dL
Hgb urine dipstick: NEGATIVE
Ketones, ur: NEGATIVE mg/dL
Leukocytes,Ua: NEGATIVE
Nitrite: NEGATIVE
Protein, ur: NEGATIVE mg/dL
Specific Gravity, Urine: 1.01 (ref 1.005–1.030)
pH: 6.5 (ref 5.0–8.0)

## 2021-03-03 LAB — RAPID URINE DRUG SCREEN, HOSP PERFORMED
Amphetamines: NOT DETECTED
Barbiturates: NOT DETECTED
Benzodiazepines: NOT DETECTED
Cocaine: NOT DETECTED
Opiates: NOT DETECTED
Tetrahydrocannabinol: POSITIVE — AB

## 2021-03-03 LAB — COMPREHENSIVE METABOLIC PANEL
ALT: 22 U/L (ref 0–44)
AST: 14 U/L — ABNORMAL LOW (ref 15–41)
Albumin: 3.8 g/dL (ref 3.5–5.0)
Alkaline Phosphatase: 53 U/L (ref 38–126)
Anion gap: 12 (ref 5–15)
BUN: 14 mg/dL (ref 6–20)
CO2: 22 mmol/L (ref 22–32)
Calcium: 8.9 mg/dL (ref 8.9–10.3)
Chloride: 102 mmol/L (ref 98–111)
Creatinine, Ser: 0.79 mg/dL (ref 0.61–1.24)
GFR, Estimated: >60 mL/min (ref >60)
Glucose, Bld: 107 mg/dL — ABNORMAL HIGH (ref 70–99)
Potassium: 3.8 mmol/L (ref 3.5–5.1)
Sodium: 136 mmol/L (ref 135–145)
Total Bilirubin: 0.6 mg/dL (ref 0.3–1.2)
Total Protein: 6.6 g/dL (ref 6.5–8.1)

## 2021-03-03 LAB — HIV ANTIBODY (ROUTINE TESTING W REFLEX): HIV Screen 4th Generation wRfx: NONREACTIVE

## 2021-03-03 LAB — RESP PANEL BY RT-PCR (FLU A&B, COVID) ARPGX2
Influenza A by PCR: NEGATIVE
Influenza B by PCR: NEGATIVE
SARS Coronavirus 2 by RT PCR: NEGATIVE

## 2021-03-03 LAB — MAGNESIUM: Magnesium: 2.1 mg/dL (ref 1.7–2.4)

## 2021-03-03 LAB — TSH: TSH: 0.351 u[IU]/mL (ref 0.350–4.500)

## 2021-03-03 LAB — CK: Total CK: 123 U/L (ref 49–397)

## 2021-03-03 MED ORDER — HEPARIN SODIUM (PORCINE) 5000 UNIT/ML IJ SOLN
5000.0000 [IU] | Freq: Three times a day (TID) | INTRAMUSCULAR | Status: DC
  Administered 2021-03-03: 14:00:00 5000 [IU] via SUBCUTANEOUS
  Filled 2021-03-03: qty 1

## 2021-03-03 MED ORDER — LORAZEPAM 2 MG/ML IJ SOLN: 2.0000 mg | INTRAMUSCULAR | Status: DC | PRN

## 2021-03-03 MED ORDER — SODIUM CHLORIDE 0.9 % IV SOLN: INTRAVENOUS | Status: DC

## 2021-03-03 MED ORDER — LEVETIRACETAM IN NACL 500 MG/100ML IV SOLN
500.0000 mg | Freq: Two times a day (BID) | INTRAVENOUS | Status: DC
  Administered 2021-03-03: 07:00:00 500 mg via INTRAVENOUS
  Filled 2021-03-03: qty 100

## 2021-03-03 MED ORDER — ACETAMINOPHEN 325 MG PO TABS: 650.0000 mg | ORAL_TABLET | Freq: Four times a day (QID) | ORAL | Status: DC | PRN

## 2021-03-03 MED ORDER — ONDANSETRON HCL 4 MG/2ML IJ SOLN: 4.0000 mg | Freq: Four times a day (QID) | INTRAMUSCULAR | Status: DC | PRN

## 2021-03-03 MED ORDER — ACETAMINOPHEN 650 MG RE SUPP: 650.0000 mg | Freq: Four times a day (QID) | RECTAL | Status: DC | PRN

## 2021-03-03 MED ORDER — OXYCODONE HCL 5 MG PO TABS: 5.0000 mg | ORAL_TABLET | ORAL | Status: DC | PRN

## 2021-03-03 MED ORDER — ONDANSETRON HCL 4 MG PO TABS: 4.0000 mg | ORAL_TABLET | Freq: Four times a day (QID) | ORAL | Status: DC | PRN

## 2021-03-03 MED ORDER — LEVETIRACETAM 500 MG PO TABS: 500.0000 mg | ORAL_TABLET | Freq: Two times a day (BID) | ORAL | 2 refills | Status: AC

## 2021-03-03 MED ORDER — MORPHINE SULFATE (PF) 2 MG/ML IV SOLN: 2.0000 mg | INTRAVENOUS | Status: DC | PRN

## 2021-03-03 NOTE — Progress Notes (Signed)
EEG complete - results pending 

## 2021-03-03 NOTE — Discharge Summary (Signed)
Physician Discharge Summary  Eddie [name] WUJ:[id number] DOB: 07/07/1990 DOA: 03/02/2021  PCP: Mirna Mires, MD  Admit date: 03/02/2021 Discharge date: 03/03/2021  Admitted From:  HOME  Disposition: HOME   Recommendations for Outpatient Follow-up:  Follow up with neurologist in 4-6 weeks  Follow up with PCP in 2 weeks No driving or operating machinery until cleared by neurologist OUTPATIENT MRI BRAIN RECOMMENDED.     Discharge Condition: STABLE   CODE STATUS: FULL  DIET:  regular    Brief Hospitalization Summary: Please see all hospital notes, images, labs for full details of the hospitalization. ADMISSION HPI:  30 y.o. male, with seizures and tobacco use disorder presents to ED with a chief complaint of status.  Patient is obtunded and not able to provide any history.  Fianc at bedside reports that the patient got to go to the bathroom.  30 year old found him on the floor in the bathroom.  He had said it was hard to move.  He had to crawl to the bathroom door to unlock it.  79 year old daughter panicked and called family who came in picked up patient off the floor improving to count.  They called fianc who then came to check his pulse and breathing which were both normal.  Patient has been nonverbal since saying it was hard to move his body.  It is an unknown amount of time that he was in the bathroom.  He has had no complaints the past couple of days.  His last normal meal was probably noon on Christmas Day.  There is questionable compliance with his anticonvulsants at home.  He also reports she is known him about 16 years and in the past it would only take him about 20 minutes to come out of a postictal state.  He is not sure when his last seizure was.  No further history could be obtained at this time.   He also reports that the patient smokes cigars.  He does not drink alcohol.  He does drink red bull.  He is not vaccinated for COVID.  Patient is full code.   In the ED Temp  97.8, heart rate 67-81, respiratory rate 13-24, blood pressure 120/56 satting at 96% White blood cell count 10.2, hemoglobin 13.7, platelets 208 Chemistry panel is unremarkable Respiratory panel is negative CT head shows no acute intracranial abnormality EKG shows a sinus rhythm 89, QTc 437 Patient was loaded with Keppra 1500 mg and observed for some time.  He still is not coming out of his presumed postictal state so admission requested  HOSPITAL COURSE  This patient was admitted for altered mental status secondary to postictal state from having a seizure at home.  He has been off his antiepileptics for quite some time.  He was started on IV Keppra and he seemed to tolerate this very well.  He was initially in a postictal state and was given supportive measures in the hospital.  He was seen by the inpatient neurologist and evaluated.  He had an EEG done that did not show any signs of epileptic activity at the time.  The neurologist recommended an MRI of the brain however MRI is not available today because of the Christmas holiday.  She said that it would be okay for him to have an MRI outpatient when he follows up with his neurologist.  She has recommended that the patient start Keppra 500 mg twice daily.  A prescription was given for him and seizure precautions given.  Patient was advised  no driving or operating machinery until he can be cleared by his neurologist.  He should see his neurologist in 4 to 6 weeks.  I made an ambulatory referral to neurology for 4 weeks.  Patient is feeling better he is eating and drinking well he has had no further seizure activity and his postictal state is resolved.  He is stable to discharge home today.  He was given copious amounts of written materials regarding seizures and the importance of seizure precautions and the importance of outpatient follow-up.  Discharge Diagnoses:  Principal Problem:   Acute metabolic encephalopathy Active Problems:   Altered mental  status   Seizure [facility])  Discharge Instructions:  Seizure precautions: Per [facility] statutes, patients with seizures are not allowed to drive until they have been seizure-free for six months and cleared by a physician    Use caution when using heavy equipment or power tools. Avoid working on ladders or at heights. Take showers instead of baths. Ensure the water temperature is not too high on the home water heater. Do not go swimming alone. Do not lock yourself in a room alone (i.e. bathroom). When caring for infants or small children, sit down when holding, feeding, or changing them to minimize risk of injury to the child in the event you have a seizure. Maintain good sleep hygiene. Avoid alcohol.    If patient has another seizure, call 911 and bring them back to the ED if: A.  The seizure lasts longer than 5 minutes.      B.  The patient doesn't wake shortly after the seizure or has new problems such as difficulty seeing, speaking or moving following the seizure C.  The patient was injured during the seizure D.  The patient has a temperature over 102 F (39C) E.  The patient vomited during the seizure and now is having trouble breathing    During the Seizure   - First, ensure adequate ventilation and place patients on the floor on their left side  Loosen clothing around the neck and ensure the airway is patent. If the patient is clenching the teeth, do not force the mouth open with any object as this can cause severe damage - Remove all items from the surrounding that can be hazardous. The patient may be oblivious to what's happening and may not even know what he or she is doing. If the patient is confused and wandering, either gently guide him/her away and block access to outside areas - Reassure the individual and be comforting - Call 911. In most cases, the seizure ends before EMS arrives. However, there are cases when seizures may last over 3 to 5 minutes. Or the individual may  have developed breathing difficulties or severe injuries. If a pregnant patient or a person with diabetes develops a seizure, it is prudent to call an ambulance. - Finally, if the patient does not regain full consciousness, then call EMS. Most patients will remain confused for about 45 to 90 minutes after a seizure, so you must use judgment in calling for help. - Avoid restraints but make sure the patient is in a bed with padded side rails - Place the individual in a lateral position with the neck slightly flexed; this will help the saliva drain from the mouth and prevent the tongue from falling backward - Remove all nearby furniture and other hazards from the area - Provide verbal assurance as the individual is regaining consciousness - Provide the patient with privacy if  possible - Call for help and start treatment as ordered by the caregiver    After the Seizure (Postictal Stage)   After a seizure, most patients experience confusion, fatigue, muscle pain and/or a headache. Thus, one should permit the individual to sleep. For the next few days, reassurance is essential. Being calm and helping reorient the person is also of importance.   Most seizures are painless and end spontaneously. Seizures are not harmful to others but can lead to complications such as stress on the lungs, brain and the heart. Individuals with prior lung problems may develop labored breathing and respiratory distress.  Discharge Instructions     Ambulatory referral to Neurology   Complete by: As directed    An appointment is requested in approximately: 4 weeks      Allergies as of 03/03/2021       Reactions   Dilantin [phenytoin Sodium Extended] Rash   Tegretol [carbamazepine]    Divalproex Sodium Rash        Medication List     STOP taking these medications    traMADol 50 MG tablet Commonly known as: ULTRAM       TAKE these medications    levETIRAcetam 500 MG tablet Commonly known as:  Keppra Take 1 tablet (500 mg total) by mouth 2 (two) times daily.        Follow-up Information     Beryle Beams, MD .   Specialty: Neurology Why: you should be seen in next 2 weeks Contact information: [address] [place] [address] [phone]         [facility] EMERGENCY DEPARTMENT .   Specialty: Emergency Medicine Why: If symptoms worsen Contact information: [address] [id number] [address] [place] [address] [phone]               Allergies  Allergen Reactions   Dilantin [Phenytoin Sodium Extended] Rash   Tegretol [Carbamazepine]    Divalproex Sodium Rash   Allergies as of 03/03/2021       Reactions   Dilantin [phenytoin Sodium Extended] Rash   Tegretol [carbamazepine]    Divalproex Sodium Rash        Medication List     STOP taking these medications    traMADol 50 MG tablet Commonly known as: ULTRAM       TAKE these medications    levETIRAcetam 500 MG tablet Commonly known as: Keppra Take 1 tablet (500 mg total) by mouth 2 (two) times daily.        Procedures/Studies: CT Head Wo Contrast  Result Date: 03/02/2021 CLINICAL DATA:  Found unresponsive, initial encounter EXAM: CT HEAD WITHOUT CONTRAST TECHNIQUE: Contiguous axial images were obtained from the base of the skull through the vertex without intravenous contrast. COMPARISON:  05/29/2009 FINDINGS: Brain: No evidence of acute infarction, hemorrhage, hydrocephalus, extra-axial collection or mass lesion/mass effect. Vascular: No hyperdense vessel or unexpected calcification. Skull: Normal. Negative for fracture or focal lesion. Sinuses/Orbits: No acute finding. Other: None. IMPRESSION: No acute intracranial abnormality noted. Electronically Signed   By: Alcide Clever M.D.   On: 03/02/2021 22:46   EEG adult  Result Date: 03/03/2021 Charlsie Quest, MD     03/03/2021  4:16 PM Patient Name: DORSEL FLINN MRN: [id number] Epilepsy Attending: Charlsie Quest  Referring Physician/Provider: Lilyan Gilford, DO Date: 03/03/2021 Duration: 24.14 mins Patient history: 29yo M with h/o epilepsy presented with alteration of awareness. EEG to evaluate for seizure Level of alertness: Awake, asleep AEDs during EEG study: LEV  Technical aspects: This EEG study was done with scalp electrodes positioned according to the 10-20 International system of electrode placement. Electrical activity was acquired at a sampling rate of  and reviewed with a high frequency filter of  and a low frequency filter of . EEG data were recorded continuously and digitally stored. Description: The posterior dominant rhythm consists of 9-10 Hz activity of moderate voltage (25-35 uV) seen predominantly in posterior head regions, symmetric and reactive to eye opening and eye closing. Sleep was characterized by vertex waves, sleep spindles (12 to 14 Hz), maximal frontocentral region. Hyperventilation and photic stimulation were not performed.   IMPRESSION: This study is within normal limits. No seizures or epileptiform discharges were seen throughout the recording. Priyanka Annabelle Harman     Subjective: Patient's mentation has returned to baseline.  He is feeling better.  He is eating and drinking well.  He has no complaints or other symptoms.  Discharge Exam: Vitals:   03/03/21 0452 03/03/21 1453  BP: (!) 110/50 137/66  Pulse: 62 74  Resp: 16 19  Temp: 98.2 F (36.8 C) 97.9 F (36.6 C)  SpO2: 98% 100%   Vitals:   03/03/21 0330 03/03/21 0400 03/03/21 0452 03/03/21 1453  BP: (!) 111/57 (!) 118/58 (!) 110/50 137/66  Pulse: 64 63 62 74  Resp: Temp:   98.2 F (36.8 C) 97.9 F (36.6 C)  TempSrc:   Axillary Oral  SpO2: 98% 98% 98% 100%  Weight:      Height:    (1.727 m)     General: Pt is alert, awake, not in acute distress Cardiovascular: RRR, S1/S2 +, no rubs, no gallops Respiratory: CTA bilaterally, no wheezing, no rhonchi Abdominal: Soft, NT, ND, bowel  sounds + Extremities: no edema, no cyanosis Neurological exam: Nonfocal exam.   The results of significant diagnostics from this hospitalization (including imaging, microbiology, ancillary and laboratory) are listed below for reference.     Microbiology: Recent Results (from the past 240 hour(s))  Resp Panel by RT-PCR (Flu A&B, Covid) Nasopharyngeal Swab     Status: None   Collection Time: 03/03/21  3:00 AM   Specimen: Nasopharyngeal Swab; Nasopharyngeal(NP) swabs in vial transport medium  Result Value Ref Range Status   SARS Coronavirus 2 by RT PCR NEGATIVE NEGATIVE Final    Comment: (NOTE) SARS-CoV-2 target nucleic acids are NOT DETECTED.  The SARS-CoV-2 RNA is generally detectable in upper respiratory specimens during the acute phase of infection. The lowest concentration of SARS-CoV-2 viral copies this assay can detect is 138 copies/mL. A negative result does not preclude SARS-Cov-2 infection and should not be used as the sole basis for treatment or other patient management decisions. A negative result may occur with  improper specimen collection/handling, submission of specimen other than nasopharyngeal swab, presence of viral mutation(s) within the areas targeted by this assay, and inadequate number of viral copies(<138 copies/mL). A negative result must be combined with clinical observations, patient history, and epidemiological information. The expected result is Negative.  Fact Sheet for Patients:  BloggerCourse.com  Fact Sheet for Healthcare Providers:  SeriousBroker.it  This test is no t yet approved or cleared by the [place] FDA and  has been authorized for detection and/or diagnosis of SARS-CoV-2 by FDA under an Emergency Use Authorization (EUA). This EUA will remain  in effect (meaning this test can be used) for the duration of the COVID-19 declaration under Section 564(b)(1) of the Act, 21 U.S.C.section  360bbb-3(b)(1), unless the  authorization is terminated  or revoked sooner.       Influenza A by PCR NEGATIVE NEGATIVE Final   Influenza B by PCR NEGATIVE NEGATIVE Final    Comment: (NOTE) The Xpert Xpress SARS-CoV-2/FLU/RSV plus assay is intended as an aid in the diagnosis of influenza from Nasopharyngeal swab specimens and should not be used as a sole basis for treatment. Nasal washings and aspirates are unacceptable for Xpert Xpress SARS-CoV-2/FLU/RSV testing.  Fact Sheet for Patients: BloggerCourse.com  Fact Sheet for Healthcare Providers: SeriousBroker.it  This test is not yet approved or cleared by the [place] FDA and has been authorized for detection and/or diagnosis of SARS-CoV-2 by FDA under an Emergency Use Authorization (EUA). This EUA will remain in effect (meaning this test can be used) for the duration of the COVID-19 declaration under Section 564(b)(1) of the Act, 21 U.S.C. section 360bbb-3(b)(1), unless the authorization is terminated or revoked.  Performed at [facility], [address], [place], [place] [address]      Labs: BNP (last 3 results) No results for input(s): BNP in the last 8760 hours. Basic Metabolic Panel: Recent Labs  Lab 03/02/21 2050 03/03/21 0450  NA 136 136  K 3.6 3.8  CL 102 102  CO2 21* 22  GLUCOSE 144* 107*  BUN 15 14  CREATININE 0.99 0.79  CALCIUM 8.7* 8.9  MG  --  2.1   Liver Function Tests: Recent Labs  Lab 03/02/21 2050 03/03/21 0450  AST 23 14*  ALT 25 22  ALKPHOS 62 53  BILITOT 0.5 0.6  PROT 7.8 6.6  ALBUMIN 4.5 3.8   No results for input(s): LIPASE, AMYLASE in the last 168 hours. No results for input(s): AMMONIA in the last 168 hours. CBC: Recent Labs  Lab 03/02/21 2050 03/03/21 0450  WBC 10.2 9.9  NEUTROABS 8.1* 8.0*  HGB 13.7 12.2*  HCT 40.2 36.4*  MCV 85.2 84.3  PLT 208 197   Cardiac Enzymes: Recent Labs  Lab 03/03/21 0450  CKTOTAL 123    BNP: Invalid input(s): POCBNP CBG: No results for input(s): GLUCAP in the last 168 hours. D-Dimer No results for input(s): DDIMER in the last 72 hours. Hgb A1c No results for input(s): HGBA1C in the last 72 hours. Lipid Profile No results for input(s): CHOL, HDL, LDLCALC, TRIG, CHOLHDL, LDLDIRECT in the last 72 hours. Thyroid function studies Recent Labs    03/03/21 0450  TSH 0.351   Anemia work up No results for input(s): VITAMINB12, FOLATE, FERRITIN, TIBC, IRON, RETICCTPCT in the last 72 hours. Urinalysis    Component Value Date/Time   COLORURINE YELLOW 08/13/2007 2113   APPEARANCEUR CLEAR 08/13/2007 2113   LABSPEC 1.020 08/13/2007 2113   PHURINE 7.5 08/13/2007 2113   GLUCOSEU NEGATIVE 08/13/2007 2113   HGBUR NEGATIVE 08/13/2007 2113   BILIRUBINUR NEGATIVE 08/13/2007 2113   KETONESUR NEGATIVE 08/13/2007 2113   PROTEINUR NEGATIVE 08/13/2007 2113   UROBILINOGEN 4.0 (H) 08/13/2007 2113   NITRITE NEGATIVE 08/13/2007 2113   LEUKOCYTESUR  08/13/2007 2113    NEGATIVE MICROSCOPIC NOT DONE ON URINES WITH NEGATIVE PROTEIN, BLOOD, LEUKOCYTES, NITRITE, OR GLUCOSE <1000 mg/dL.   Sepsis Labs Invalid input(s): PROCALCITONIN,  WBC,  LACTICIDVEN Microbiology Recent Results (from the past 240 hour(s))  Resp Panel by RT-PCR (Flu A&B, Covid) Nasopharyngeal Swab     Status: None   Collection Time: 03/03/21  3:00 AM   Specimen: Nasopharyngeal Swab; Nasopharyngeal(NP) swabs in vial transport medium  Result Value Ref Range Status   SARS Coronavirus 2 by  RT PCR NEGATIVE NEGATIVE Final    Comment: (NOTE) SARS-CoV-2 target nucleic acids are NOT DETECTED.  The SARS-CoV-2 RNA is generally detectable in upper respiratory specimens during the acute phase of infection. The lowest concentration of SARS-CoV-2 viral copies this assay can detect is 138 copies/mL. A negative result does not preclude SARS-Cov-2 infection and should not be used as the sole basis for treatment or other patient  management decisions. A negative result may occur with  improper specimen collection/handling, submission of specimen other than nasopharyngeal swab, presence of viral mutation(s) within the areas targeted by this assay, and inadequate number of viral copies(<138 copies/mL). A negative result must be combined with clinical observations, patient history, and epidemiological information. The expected result is Negative.  Fact Sheet for Patients:  BloggerCourse.com  Fact Sheet for Healthcare Providers:  SeriousBroker.it  This test is no t yet approved or cleared by the [place] FDA and  has been authorized for detection and/or diagnosis of SARS-CoV-2 by FDA under an Emergency Use Authorization (EUA). This EUA will remain  in effect (meaning this test can be used) for the duration of the COVID-19 declaration under Section 564(b)(1) of the Act, 21 U.S.C.section 360bbb-3(b)(1), unless the authorization is terminated  or revoked sooner.       Influenza A by PCR NEGATIVE NEGATIVE Final   Influenza B by PCR NEGATIVE NEGATIVE Final    Comment: (NOTE) The Xpert Xpress SARS-CoV-2/FLU/RSV plus assay is intended as an aid in the diagnosis of influenza from Nasopharyngeal swab specimens and should not be used as a sole basis for treatment. Nasal washings and aspirates are unacceptable for Xpert Xpress SARS-CoV-2/FLU/RSV testing.  Fact Sheet for Patients: BloggerCourse.com  Fact Sheet for Healthcare Providers: SeriousBroker.it  This test is not yet approved or cleared by the [place] FDA and has been authorized for detection and/or diagnosis of SARS-CoV-2 by FDA under an Emergency Use Authorization (EUA). This EUA will remain in effect (meaning this test can be used) for the duration of the COVID-19 declaration under Section 564(b)(1) of the Act, 21 U.S.C. section 360bbb-3(b)(1),  unless the authorization is terminated or revoked.  Performed at [facility], [address], [place], [place] [address]     Time coordinating discharge: 35 mins   SIGNED:  Standley Dakins, MD  [facility] 03/03/2021, 5:10 PM How to contact the [facility] Attending or Consulting provider 7A - 7P or covering provider during after hours 7P -7A, for this patient?  Check the care team in [facility] and look for a) attending/consulting [facility] provider listed and b) the [facility] team listed Log into [email or web address] and use [place]'s universal password to access. If you do not have the password, please contact the hospital operator. Locate the [facility] provider you are looking for under [facility] and page to a number that you can be directly reached. If you still have difficulty reaching the provider, please page the [facility] (Director on Call) for the Hospitalists listed on amion for assistance.

## 2021-03-03 NOTE — Progress Notes (Signed)
ASSUMPTION OF CARE NOTE   03/03/2021 11:49 AM  Eddie [name] was seen and examined.  The H&P by the admitting provider, orders, imaging was reviewed.  Please see new orders.  Will continue to follow.  Pt remains obtunded.  He is NPO.  Continue IV keppra.  Adult EEG pending.  Discussed with his family at bedside.  I told them that he is not allowed to drive or operate machinery until he has been cleared by a neurologist to do so.  They verbalized understanding.    Vitals:   03/03/21 0400 03/03/21 0452  BP: (!) 118/58 (!) 110/50  Pulse: 63 62  Resp: 13 16  Temp:  98.2 F (36.8 C)  SpO2: 98% 98%    Results for orders placed or performed during the hospital encounter of 03/02/21  Resp Panel by RT-PCR (Flu A&B, Covid) Nasopharyngeal Swab   Specimen: Nasopharyngeal Swab; Nasopharyngeal(NP) swabs in vial transport medium  Result Value Ref Range   SARS Coronavirus 2 by RT PCR NEGATIVE NEGATIVE   Influenza A by PCR NEGATIVE NEGATIVE   Influenza B by PCR NEGATIVE NEGATIVE  CBC with Differential  Result Value Ref Range   WBC 10.2 4.0 - 10.5 K/uL   RBC 4.72 4.22 - 5.81 MIL/uL   Hemoglobin 13.7 13.0 - 17.0 g/dL   HCT 75.1 70.0 - 17.4 %   MCV 85.2 80.0 - 100.0 fL   MCH 29.0 26.0 - 34.0 pg   MCHC 34.1 30.0 - 36.0 g/dL   RDW 94.4 96.7 - 59.1 %   Platelets 208 150 - 400 K/uL   nRBC 0.0 0.0 - 0.2 %   Neutrophils Relative % 80 %   Neutro Abs 8.1 (H) 1.7 - 7.7 K/uL   Lymphocytes Relative 11 %   Lymphs Abs 1.2 0.7 - 4.0 K/uL   Monocytes Relative 8 %   Monocytes Absolute 0.8 0.1 - 1.0 K/uL   Eosinophils Relative 1 %   Eosinophils Absolute 0.1 0.0 - 0.5 K/uL   Basophils Relative 0 %   Basophils Absolute 0.0 0.0 - 0.1 K/uL   Immature Granulocytes 0 %   Abs Immature Granulocytes 0.03 0.00 - 0.07 K/uL  Comprehensive metabolic panel  Result Value Ref Range   Sodium 136 135 - 145 mmol/L   Potassium 3.6 3.5 - 5.1 mmol/L   Chloride 102 98 - 111 mmol/L   CO2 21 (L) 22 - 32 mmol/L    Glucose, Bld 144 (H) 70 - 99 mg/dL   BUN 15 6 - 20 mg/dL   Creatinine, Ser 6.38 0.61 - 1.24 mg/dL   Calcium 8.7 (L) 8.9 - 10.3 mg/dL   Total Protein 7.8 6.5 - 8.1 g/dL   Albumin 4.5 3.5 - 5.0 g/dL   AST 23 15 - 41 U/L   ALT 25 0 - 44 U/L   Alkaline Phosphatase 62 38 - 126 U/L   Total Bilirubin 0.5 0.3 - 1.2 mg/dL   GFR, Estimated >46 >65 mL/min   Anion gap 13 5 - 15  Comprehensive metabolic panel  Result Value Ref Range   Sodium 136 135 - 145 mmol/L   Potassium 3.8 3.5 - 5.1 mmol/L   Chloride 102 98 - 111 mmol/L   CO2 22 22 - 32 mmol/L   Glucose, Bld 107 (H) 70 - 99 mg/dL   BUN 14 6 - 20 mg/dL   Creatinine, Ser 9.93 0.61 - 1.24 mg/dL   Calcium 8.9 8.9 - 57.0 mg/dL   Total Protein 6.6  6.5 - 8.1 g/dL   Albumin 3.8 3.5 - 5.0 g/dL   AST 14 (L) 15 - 41 U/L   ALT 22 0 - 44 U/L   Alkaline Phosphatase 53 38 - 126 U/L   Total Bilirubin 0.6 0.3 - 1.2 mg/dL   GFR, Estimated >61 >95 mL/min   Anion gap 12 5 - 15  Magnesium  Result Value Ref Range   Magnesium 2.1 1.7 - 2.4 mg/dL  CBC WITH DIFFERENTIAL  Result Value Ref Range   WBC 9.9 4.0 - 10.5 K/uL   RBC 4.32 4.22 - 5.81 MIL/uL   Hemoglobin 12.2 (L) 13.0 - 17.0 g/dL   HCT 09.3 (L) 26.7 - 12.4 %   MCV 84.3 80.0 - 100.0 fL   MCH 28.2 26.0 - 34.0 pg   MCHC 33.5 30.0 - 36.0 g/dL   RDW 58.0 99.8 - 33.8 %   Platelets 197 150 - 400 K/uL   nRBC 0.0 0.0 - 0.2 %   Neutrophils Relative % 81 %   Neutro Abs 8.0 (H) 1.7 - 7.7 K/uL   Lymphocytes Relative 13 %   Lymphs Abs 1.3 0.7 - 4.0 K/uL   Monocytes Relative 6 %   Monocytes Absolute 0.6 0.1 - 1.0 K/uL   Eosinophils Relative 0 %   Eosinophils Absolute 0.0 0.0 - 0.5 K/uL   Basophils Relative 0 %   Basophils Absolute 0.0 0.0 - 0.1 K/uL   Immature Granulocytes 0 %   Abs Immature Granulocytes 0.03 0.00 - 0.07 K/uL  TSH  Result Value Ref Range   TSH 0.351 0.350 - 4.500 uIU/mL   C. Laural Benes, MD [facility]   03/02/2021  8:34 PM How to contact the [facility] Attending or Consulting  provider 7A - 7P or covering provider during after hours 7P -7A, for this patient?  Check the care team in [facility] and look for a) attending/consulting [facility] provider listed and b) the [facility] team listed Log into [email or web address] and use [place]'s universal password to access. If you do not have the password, please contact the hospital operator. Locate the [facility] provider you are looking for under [facility] and page to a number that you can be directly reached. If you still have difficulty reaching the provider, please page the [facility] (Director on Call) for the Hospitalists listed on amion for assistance.

## 2021-03-03 NOTE — H&P (Signed)
TRH H&P    Patient Demographics:    Eddie [name], is a 30 y.o. male  MRN: [id number]  DOB - 1991-01-02  Admit Date - 03/02/2021  Referring MD/NP/PA: Preston Fleeting  Outpatient Primary MD for the patient is Mirna Mires, MD  Patient coming from: Home  Chief complaint- Altered mental status   HPI:    Eddie [name]  is a 30 y.o. male, with seizures and tobacco use disorder presents to ED with a chief complaint of status.  Patient is obtunded and not able to provide any history.  Fianc at bedside reports that the patient got to go to the bathroom.  31 year old found him on the floor in the bathroom.  He had said it was hard to move.  He had to crawl to the bathroom door to unlock it.  33 year old daughter panicked and called family who came in picked up patient off the floor improving to count.  They called fianc who then came to check his pulse and breathing which were both normal.  Patient has been nonverbal since saying it was hard to move his body.  It is an unknown amount of time that he was in the bathroom.  He has had no complaints the past couple of days.  His last normal meal was probably noon on Christmas Day.  There is questionable compliance with his anticonvulsants at home.  He also reports she is known him about 16 years and in the past it would only take him about 20 minutes to come out of a postictal state.  He is not sure when his last seizure was.  No further history could be obtained at this time.  He also reports that the patient smokes cigars.  He does not drink alcohol.  He does drink red bull.  He is not vaccinated for COVID.  Patient is full code.  In the ED Temp 97.8, heart rate 67-81, respiratory rate 13-24, blood pressure 120/56 satting at 96% White blood cell count 10.2, hemoglobin 13.7, platelets 208 Chemistry panel is unremarkable Respiratory panel is negative CT head shows no acute  intracranial abnormality EKG shows a sinus rhythm 89, QTc 437 Patient was loaded with Keppra 1500 mg and observed for some time.  He still is not coming out of his presumed postictal state so admission requested    Review of systems:    Unfortunately patient is not able to provide any review of systems    Past History of the following :    Past Medical History:  Diagnosis Date   Seizures (HCC)    last one 2013      Past Surgical History:  Procedure Laterality Date   INGUINAL HERNIA REPAIR  08/21/2013   INGUINAL HERNIA REPAIR Left 08/21/2013   Procedure: LEFT HERNIA REPAIR INGUINAL ADULT;  Surgeon: Marlane Hatcher, MD;  Location: [facility];  Service: General;  Laterality: Left;      Social History:      Social History   Tobacco Use   Smoking status: Never   Smokeless tobacco: Never  Substance Use Topics   Alcohol use: No       Family History :     Family History  Problem Relation Age of Onset   Headache Mother    Seizures Father    Hypertension Father       Home Medications:   Prior to Admission medications   Medication Sig Start Date End Date Taking? Authorizing Provider  levETIRAcetam (KEPPRA) 500 MG tablet Take 1 tablet (500 mg total) by mouth 2 (two) times daily. 03/02/21   Eber Hong, MD  traMADol (ULTRAM) 50 MG tablet Take 1 tablet (50 mg total) by mouth every 6 (six) hours as needed. 06/04/17   Burgess Amor, PA-C     Allergies:     Allergies  Allergen Reactions   Dilantin [Phenytoin Sodium Extended] Rash   Tegretol [Carbamazepine]    Divalproex Sodium Rash     Physical Exam:   Vitals  Blood pressure (!) 118/58, pulse 63, temperature 97.9 F (36.6 C), temperature source Oral, resp. rate 13, height 5\' 8"  (1.727 m), weight 79.4 kg, SpO2 98 %.   1.  General: Patient lying supine in bed,  no acute distress   2. Psychiatric: Obtunded   3. Neurologic:  face is symmetric, moves all 4 extremities voluntarily when repositioning in bed,  nonverbal and not following commands   4. HEENMT:  Head is atraumatic, normocephalic, neck is supple, trachea is midline, mucous membranes are moist   5. Respiratory : Lungs are clear to auscultation bilaterally without wheezing, rhonchi, rales, no cyanosis, no increase in work of breathing or accessory muscle use   6. Cardiovascular : Heart rate normal, rhythm is regular, no murmurs, rubs or gallops, no peripheral edema, peripheral pulses palpated   7. Gastrointestinal:  Abdomen is soft, nondistended, nontender to palpation bowel sounds active, no masses or organomegaly palpated   8. Skin:  Skin is warm, dry and intact without rashes, acute lesions, or ulcers on limited exam   9.Musculoskeletal:  No acute deformities or trauma, no asymmetry in tone, no peripheral edema, peripheral pulses palpated, no tenderness to palpation in the extremities     Data Review:    CBC Recent Labs  Lab 03/02/21 2050  WBC 10.2  HGB 13.7  HCT 40.2  PLT 208  MCV 85.2  MCH 29.0  MCHC 34.1  RDW 12.6  LYMPHSABS 1.2  MONOABS 0.8  EOSABS 0.1  BASOSABS 0.0   ------------------------------------------------------------------------------------------------------------------  Results for orders placed or performed during the hospital encounter of 03/02/21 (from the past 48 hour(s))  CBC with Differential     Status: Abnormal   Collection Time: 03/02/21  8:50 PM  Result Value Ref Range   WBC 10.2 4.0 - 10.5 K/uL   RBC 4.72 4.22 - 5.81 MIL/uL   Hemoglobin 13.7 13.0 - 17.0 g/dL   HCT 03/04/21 12.8 - 78.6 %   MCV 85.2 80.0 - 100.0 fL   MCH 29.0 26.0 - 34.0 pg   MCHC 34.1 30.0 - 36.0 g/dL   RDW 76.7 20.9 - 47.0 %   Platelets 208 150 - 400 K/uL   nRBC 0.0 0.0 - 0.2 %   Neutrophils Relative % 80 %   Neutro Abs 8.1 (H) 1.7 - 7.7 K/uL   Lymphocytes Relative 11 %   Lymphs Abs 1.2 0.7 - 4.0 K/uL   Monocytes Relative 8 %   Monocytes Absolute 0.8 0.1 - 1.0 K/uL   Eosinophils Relative 1 %   Eosinophils  Absolute 0.1 0.0 - 0.5  K/uL   Basophils Relative 0 %   Basophils Absolute 0.0 0.0 - 0.1 K/uL   Immature Granulocytes 0 %   Abs Immature Granulocytes 0.03 0.00 - 0.07 K/uL    Comment: Performed at [facility], [address] [place], [place] [address]  Comprehensive metabolic panel     Status: Abnormal   Collection Time: 03/02/21  8:50 PM  Result Value Ref Range   Sodium 136 135 - 145 mmol/L   Potassium 3.6 3.5 - 5.1 mmol/L   Chloride 102 98 - 111 mmol/L   CO2 21 (L) 22 - 32 mmol/L   Glucose, Bld 144 (H) 70 - 99 mg/dL    Comment: Glucose reference range applies only to samples taken after fasting for at least 8 hours.   BUN 15 6 - 20 mg/dL   Creatinine, Ser 5.27 0.61 - 1.24 mg/dL   Calcium 8.7 (L) 8.9 - 10.3 mg/dL   Total Protein 7.8 6.5 - 8.1 g/dL   Albumin 4.5 3.5 - 5.0 g/dL   AST 23 15 - 41 U/L   ALT 25 0 - 44 U/L   Alkaline Phosphatase 62 38 - 126 U/L   Total Bilirubin 0.5 0.3 - 1.2 mg/dL   GFR, Estimated >78 >24 mL/min    Comment: (NOTE) Calculated using the CKD-EPI Creatinine Equation (2021)    Anion gap 13 5 - 15    Comment: Performed at [facility], [address] [place], [place] [address]  Resp Panel by RT-PCR (Flu A&B, Covid) Nasopharyngeal Swab     Status: None   Collection Time: 03/03/21  3:00 AM   Specimen: Nasopharyngeal Swab; Nasopharyngeal(NP) swabs in vial transport medium  Result Value Ref Range   SARS Coronavirus 2 by RT PCR NEGATIVE NEGATIVE    Comment: (NOTE) SARS-CoV-2 target nucleic acids are NOT DETECTED.  The SARS-CoV-2 RNA is generally detectable in upper respiratory specimens during the acute phase of infection. The lowest concentration of SARS-CoV-2 viral copies this assay can detect is 138 copies/mL. A negative result does not preclude SARS-Cov-2 infection and should not be used as the sole basis for treatment or other patient management decisions. A negative result may occur with  improper specimen collection/handling, submission of specimen  other than nasopharyngeal swab, presence of viral mutation(s) within the areas targeted by this assay, and inadequate number of viral copies(<138 copies/mL). A negative result must be combined with clinical observations, patient history, and epidemiological information. The expected result is Negative.  Fact Sheet for Patients:  BloggerCourse.com  Fact Sheet for Healthcare Providers:  SeriousBroker.it  This test is no t yet approved or cleared by the [place] FDA and  has been authorized for detection and/or diagnosis of SARS-CoV-2 by FDA under an Emergency Use Authorization (EUA). This EUA will remain  in effect (meaning this test can be used) for the duration of the COVID-19 declaration under Section 564(b)(1) of the Act, 21 U.S.C.section 360bbb-3(b)(1), unless the authorization is terminated  or revoked sooner.       Influenza A by PCR NEGATIVE NEGATIVE   Influenza B by PCR NEGATIVE NEGATIVE    Comment: (NOTE) The Xpert Xpress SARS-CoV-2/FLU/RSV plus assay is intended as an aid in the diagnosis of influenza from Nasopharyngeal swab specimens and should not be used as a sole basis for treatment. Nasal washings and aspirates are unacceptable for Xpert Xpress SARS-CoV-2/FLU/RSV testing.  Fact Sheet for Patients: BloggerCourse.com  Fact Sheet for Healthcare Providers: SeriousBroker.it  This test is not yet approved or cleared  by the [place] and has been authorized for detection and/or diagnosis of SARS-CoV-2 by FDA under an Emergency Use Authorization (EUA). This EUA will remain in effect (meaning this test can be used) for the duration of the COVID-19 declaration under Section 564(b)(1) of the Act, 21 U.S.C. section 360bbb-3(b)(1), unless the authorization is terminated or revoked.  Performed at [facility], [address] [place], [place] [address]      Chemistries  Recent Labs  Lab 03/02/21 2050  NA 136  K 3.6  CL 102  CO2 21*  GLUCOSE 144*  BUN 15  CREATININE 0.99  CALCIUM 8.7*  AST 23  ALT 25  ALKPHOS 62  BILITOT 0.5   ------------------------------------------------------------------------------------------------------------------  ------------------------------------------------------------------------------------------------------------------ GFR: Estimated Creatinine Clearance: 105.6 mL/min (by C-G formula based on SCr of 0.99 mg/dL). Liver Function Tests: Recent Labs  Lab 03/02/21 2050  AST 23  ALT 25  ALKPHOS 62  BILITOT 0.5  PROT 7.8  ALBUMIN 4.5   No results for input(s): LIPASE, AMYLASE in the last 168 hours. No results for input(s): AMMONIA in the last 168 hours. Coagulation Profile: No results for input(s): INR, PROTIME in the last 168 hours. Cardiac Enzymes: No results for input(s): CKTOTAL, CKMB, CKMBINDEX, TROPONINI in the last 168 hours. BNP (last 3 results) No results for input(s): PROBNP in the last 8760 hours. HbA1C: No results for input(s): HGBA1C in the last 72 hours. CBG: No results for input(s): GLUCAP in the last 168 hours. Lipid Profile: No results for input(s): CHOL, HDL, LDLCALC, TRIG, CHOLHDL, LDLDIRECT in the last 72 hours. Thyroid Function Tests: No results for input(s): TSH, T4TOTAL, FREET4, T3FREE, THYROIDAB in the last 72 hours. Anemia Panel: No results for input(s): VITAMINB12, FOLATE, FERRITIN, TIBC, IRON, RETICCTPCT in the last 72 hours.  --------------------------------------------------------------------------------------------------------------- Urine analysis:    Component Value Date/Time   COLORURINE YELLOW 08/13/2007 2113   APPEARANCEUR CLEAR 08/13/2007 2113   LABSPEC 1.020 08/13/2007 2113   PHURINE 7.5 08/13/2007 2113   GLUCOSEU NEGATIVE 08/13/2007 2113   HGBUR NEGATIVE 08/13/2007 2113   BILIRUBINUR NEGATIVE 08/13/2007 2113   KETONESUR NEGATIVE  08/13/2007 2113   PROTEINUR NEGATIVE 08/13/2007 2113   UROBILINOGEN 4.0 (H) 08/13/2007 2113   NITRITE NEGATIVE 08/13/2007 2113   LEUKOCYTESUR  08/13/2007 2113    NEGATIVE MICROSCOPIC NOT DONE ON URINES WITH NEGATIVE PROTEIN, BLOOD, LEUKOCYTES, NITRITE, OR GLUCOSE <1000 mg/dL.      Imaging Results:    CT Head Wo Contrast  Result Date: 03/02/2021 CLINICAL DATA:  Found unresponsive, initial encounter EXAM: CT HEAD WITHOUT CONTRAST TECHNIQUE: Contiguous axial images were obtained from the base of the skull through the vertex without intravenous contrast. COMPARISON:  05/29/2009 FINDINGS: Brain: No evidence of acute infarction, hemorrhage, hydrocephalus, extra-axial collection or mass lesion/mass effect. Vascular: No hyperdense vessel or unexpected calcification. Skull: Normal. Negative for fracture or focal lesion. Sinuses/Orbits: No acute finding. Other: None. IMPRESSION: No acute intracranial abnormality noted. Electronically Signed   By: Alcide Clever M.D.   On: 03/02/2021 22:46    My personal review of EKG: Rhythm NSR, rate 89, sinus rhythm, QTC 437   Assessment & Plan:    Principal Problem:   Acute metabolic encephalopathy   Acute metabolic encephalopathy Normal head CT Normal electrolytes No signs of infection UDS and UA pending History of seizures, presumed at this time the patient did have seizure especially given questionable compliance with anticonvulsants Aspiration precautions, EEG, continue Keppra as needed Ativan Continue to monitor Seizures See plan above   DVT  Prophylaxis-   Heparin - SCDs   AM Labs Ordered, also please review Full Orders  Family Communication: Admission, patients condition and plan of care including tests being ordered have been discussed with the patient and fianc who indicate understanding and agree with the plan and Code Status.  Code Status: Full  Admission status: Observation Disposition: Anticipated Discharge date 24 hours discharge to  home  Time spent in minutes : 65   Quetzally Callas B Zierle-Ghosh DO

## 2021-03-03 NOTE — Progress Notes (Signed)
°  Transition of Care [facility]) Screening Note   Patient Details  Name: AMERICO VALLERY Date of Birth: 1991/01/18   Transition of Care [facility]) CM/SW Contact:    Elliot Gault, LCSW Phone Number: 03/03/2021, 10:29 AM    Transition of Care Department [facility]) has reviewed patient and no TOC needs have been identified at this time. Will continue to monitor patient advancement through interdisciplinary progression rounds. If new patient transition needs arise, please place a TOC consult.

## 2021-03-03 NOTE — Procedures (Signed)
Patient Name: GAEL LONDO  MRN: [id number]  Epilepsy Attending: Charlsie Quest  Referring Physician/Provider: Lilyan Gilford, DO Date: 03/03/2021 Duration: 24.14 mins  Patient history: 30yo M with h/o epilepsy presented with alteration of awareness. EEG to evaluate for seizure  Level of alertness: Awake, asleep  AEDs during EEG study: LEV  Technical aspects: This EEG study was done with scalp electrodes positioned according to the 10-20 International system of electrode placement. Electrical activity was acquired at a sampling rate of 500Hz  and reviewed with a high frequency filter of 70Hz  and a low frequency filter of 1Hz . EEG data were recorded continuously and digitally stored.   Description: The posterior dominant rhythm consists of 9-10 Hz activity of moderate voltage (25-35 uV) seen predominantly in posterior head regions, symmetric and reactive to eye opening and eye closing. Sleep was characterized by vertex waves, sleep spindles (12 to 14 Hz), maximal frontocentral region. Hyperventilation and photic stimulation were not performed.     IMPRESSION: This study is within normal limits. No seizures or epileptiform discharges were seen throughout the recording.  Lorimer Tiberio 

## 2021-03-03 NOTE — Consult Note (Addendum)
I connected with  Eddie [name] on 03/03/21 by a video enabled telemedicine application and verified that I am speaking with the correct person using two identifiers.   I discussed the limitations of evaluation and management by telemedicine. The patient expressed understanding and agreed to proceed.  [name] Location of physician: [facility] ([facility]   Neurology Consultation Reason for Consult: seizure Referring Physician: Dr Standley Dakins  CC: seizure  History is obtained from: patient, chart review  HPI: Eddie [name] is a 30 y.o. male with PMH of epilepsy who presented after an episode of alteration of awareness.  Patient states he was using the bathroom and didn't feel well so called his daughter. However his door was locked so she couldn't get in. Daughter called family who came and saw patient on the floor, unable to answer questions. Patient reports recently increased stress. Patient states he has been off keppra as he has been seizure free.Denies any alcohol use, recent illness.   Epilepsy history from patient and review of clinic notes by NP Darrol Angel dated 01/20/2013: had seizures starting at age 34. Describes aura of feeling warm and lightheaded followed by alteration of awareness and then generalized convulsion, at times with tongue bite and bladder incontinence. Has had right shoulder injury in past due to seizure.   Patient has had a visit on 03/02/2015 and 04/23/2017 for seizure in setting of alcohol use, was not taking keppra at that time either.   He has also had a visit for syncope on 05/26/2015 for syncope. Unclear if this was due to seizure.   Epilepsy risk factors: father and one of the brothers has seizures.   Prior AEDs: Dilantin ( rash), VPA  ROS: All other systems reviewed and negative except as noted in the HPI.   Past Medical History:  Diagnosis Date   Seizures (HCC)    last one 2013    Family  History  Problem Relation Age of Onset   Headache Mother    Seizures Father    Hypertension Father     Social History: per chart review, reports that he has never smoked. He has never used smokeless tobacco. He reports that he does not drink alcohol and does not use drugs.   Medications Prior to Admission  Medication Sig Dispense Refill Last Dose   traMADol (ULTRAM) 50 MG tablet Take 1 tablet (50 mg total) by mouth every 6 (six) hours as needed. (Patient not taking: Reported on 03/03/2021) 15 tablet 0 Not Taking      Exam: Current vital signs: BP (!) 110/50 (BP Location: Left Arm)    Pulse 62    Temp 98.2 F (36.8 C) (Axillary)    Resp 16    Ht 5\' 8"  (1.727 m)    Wt 79.4 kg    SpO2 98%    BMI 26.61 kg/m  Vital signs in last 24 hours: Temp:  [97.8 F (36.6 C)-98.2 F (36.8 C)] 98.2 F (36.8 C) (12/26 0452) Pulse Rate:  [62-89] 62 (12/26 0452) Resp:  [13-24] 16 (12/26 0452) BP: (110-139)/(50-82) 110/50 (12/26 0452) SpO2:  [96 %-100 %] 98 % (12/26 0452) Weight:  [79.4 kg] 79.4 kg (12/25 2049)   Physical Exam  Constitutional: Appears well-developed and well-nourished.  Psych: Affect appropriate to situation Eyes: No scleral injection HENT: No OP obstrucion Head: Normocephalic Respiratory: Effort normal, non-labored breathing Neuro:AOx3, CN 2-12 grossly intact, anti-gravity without drift in all extremities, FTN intact BL  I have  reviewed labs in epic and the results pertinent to this consultation are: CBC:  Recent Labs  Lab 03/02/21 2050 03/03/21 0450  WBC 10.2 9.9  NEUTROABS 8.1* 8.0*  HGB 13.7 12.2*  HCT 40.2 36.4*  MCV 85.2 84.3  PLT 208 197    Basic Metabolic Panel:  Lab Results  Component Value Date   NA 136 03/03/2021   K 3.8 03/03/2021   CO2 22 03/03/2021   GLUCOSE 107 (H) 03/03/2021   BUN 14 03/03/2021   CREATININE 0.79 03/03/2021   CALCIUM 8.9 03/03/2021   GFRNONAA >60 03/03/2021   GFRAA >60 04/23/2017   Lipid Panel: No results found for:  LDLCALC HgbA1c: No results found for: HGBA1C Urine Drug Screen:     Component Value Date/Time   LABOPIA NONE DETECTED 05/29/2009 0325   COCAINSCRNUR NONE DETECTED 05/29/2009 0325   LABBENZ NONE DETECTED 05/29/2009 0325   AMPHETMU NONE DETECTED 05/29/2009 0325   THCU NONE DETECTED 05/29/2009 0325   LABBARB  05/29/2009 0325    NONE DETECTED        DRUG SCREEN FOR MEDICAL PURPOSES ONLY.  IF CONFIRMATION IS NEEDED FOR ANY PURPOSE, NOTIFY LAB WITHIN 5 DAYS.        LOWEST DETECTABLE LIMITS FOR URINE DRUG SCREEN Drug Class       Cutoff (ng/mL) Amphetamine      1000 Barbiturate      200 Benzodiazepine   200 Tricyclics       300 Opiates          300 Cocaine          300 THC              50    Alcohol Level     Component Value Date/Time   ETH 79 (H) 04/23/2017 0112     I have reviewed the images obtained: CTH wo contrast 03/02/2021: No acute intracranial abnormality noted  ASSESSMENT/PLAN: 30yo M with h/o epilepsy presented with transient alteration of awareness   Epilepsy with breakthrough seizure -  Etiology of breakthrough seizure: not being on anti-seizure medication  Recommendations: - UA, UDDS pending - EEG to assess for epileptogenicity - MR Brain wo contrast to assess for etiology of epilepsy - if workup negative, ok to discharge on keppra 500mg  BID - seizure precautions including DO NOT drive for 6 momths - f/u with neuro in 4-6 weeks  Seizure precautions: Per [facility] statutes, patients with seizures are not allowed to drive until they have been seizure-free for six months and cleared by a physician    Use caution when using heavy equipment or power tools. Avoid working on ladders or at heights. Take showers instead of baths. Ensure the water temperature is not too high on the home water heater. Do not go swimming alone. Do not lock yourself in a room alone (i.e. bathroom). When caring for infants or small children, sit down when holding, feeding, or  changing them to minimize risk of injury to the child in the event you have a seizure. Maintain good sleep hygiene. Avoid alcohol.    If patient has another seizure, call 911 and bring them back to the ED if: A.  The seizure lasts longer than 5 minutes.      B.  The patient doesn't wake shortly after the seizure or has new problems such as difficulty seeing, speaking or moving following the seizure C.  The patient was injured during the seizure D.  The patient has a temperature over  102 F (39C) E.  The patient vomited during the seizure and now is having trouble breathing    During the Seizure   - First, ensure adequate ventilation and place patients on the floor on their left side  Loosen clothing around the neck and ensure the airway is patent. If the patient is clenching the teeth, do not force the mouth open with any object as this can cause severe damage - Remove all items from the surrounding that can be hazardous. The patient may be oblivious to what's happening and may not even know what he or she is doing. If the patient is confused and wandering, either gently guide him/her away and block access to outside areas - Reassure the individual and be comforting - Call 911. In most cases, the seizure ends before EMS arrives. However, there are cases when seizures may last over 3 to 5 minutes. Or the individual may have developed breathing difficulties or severe injuries. If a pregnant patient or a person with diabetes develops a seizure, it is prudent to call an ambulance. - Finally, if the patient does not regain full consciousness, then call EMS. Most patients will remain confused for about 45 to 90 minutes after a seizure, so you must use judgment in calling for help. - Avoid restraints but make sure the patient is in a bed with padded side rails - Place the individual in a lateral position with the neck slightly flexed; this will help the saliva drain from the mouth and prevent the tongue  from falling backward - Remove all nearby furniture and other hazards from the area - Provide verbal assurance as the individual is regaining consciousness - Provide the patient with privacy if possible - Call for help and start treatment as ordered by the caregiver    After the Seizure (Postictal Stage)   After a seizure, most patients experience confusion, fatigue, muscle pain and/or a headache. Thus, one should permit the individual to sleep. For the next few days, reassurance is essential. Being calm and helping reorient the person is also of importance.   Most seizures are painless and end spontaneously. Seizures are not harmful to others but can lead to complications such as stress on the lungs, brain and the heart. Individuals with prior lung problems may develop labored breathing and respiratory distress.   Thank you for allowing [place] to participate in the care of this patient. If you have any further questions, please contact  me or neurohospitalist.   Lindie Spruce Epile[facility] neuro[facility]ist

## 2021-03-06 ENCOUNTER — Other Ambulatory Visit: Payer: Self-pay | Admitting: Family Medicine

## 2021-03-06 DIAGNOSIS — R569 Unspecified convulsions: Secondary | ICD-10-CM

## 2021-03-06 DIAGNOSIS — G40309 Generalized idiopathic epilepsy and epileptic syndromes, not intractable, without status epilepticus: Secondary | ICD-10-CM

## 2021-07-25 ENCOUNTER — Ambulatory Visit: Payer: Self-pay

## 2022-02-24 ENCOUNTER — Emergency Department (HOSPITAL_COMMUNITY)
Admission: EM | Admit: 2022-02-24 | Discharge: 2022-02-24 | Disposition: A | Discharge status: Home / Self Care | Payer: Self-pay | Attending: Emergency Medicine | Admitting: Emergency Medicine

## 2022-02-24 ENCOUNTER — Emergency Department (HOSPITAL_COMMUNITY)
Admission: EM | Admit: 2022-02-24 | Discharge: 2022-02-24 | Discharge status: Against Medical Advice | Payer: Self-pay | Attending: Emergency Medicine | Admitting: Emergency Medicine

## 2022-02-24 ENCOUNTER — Other Ambulatory Visit: Payer: Self-pay

## 2022-02-24 ENCOUNTER — Emergency Department (HOSPITAL_COMMUNITY): Payer: Self-pay

## 2022-02-24 DIAGNOSIS — R112 Nausea with vomiting, unspecified: Secondary | ICD-10-CM | POA: Insufficient documentation

## 2022-02-24 DIAGNOSIS — R030 Elevated blood-pressure reading, without diagnosis of hypertension: Secondary | ICD-10-CM | POA: Insufficient documentation

## 2022-02-24 DIAGNOSIS — D72829 Elevated white blood cell count, unspecified: Secondary | ICD-10-CM | POA: Insufficient documentation

## 2022-02-24 DIAGNOSIS — R1032 Left lower quadrant pain: Secondary | ICD-10-CM | POA: Insufficient documentation

## 2022-02-24 DIAGNOSIS — Z1152 Encounter for screening for COVID-19: Secondary | ICD-10-CM | POA: Insufficient documentation

## 2022-02-24 DIAGNOSIS — R61 Generalized hyperhidrosis: Secondary | ICD-10-CM | POA: Insufficient documentation

## 2022-02-24 DIAGNOSIS — R110 Nausea: Secondary | ICD-10-CM | POA: Insufficient documentation

## 2022-02-24 DIAGNOSIS — Z5321 Procedure and treatment not carried out due to patient leaving prior to being seen by health care provider: Secondary | ICD-10-CM | POA: Insufficient documentation

## 2022-02-24 LAB — CBC WITH DIFFERENTIAL/PLATELET
Abs Immature Granulocytes: 0.04 10*3/uL (ref 0.00–0.07)
Basophils Absolute: 0 10*3/uL (ref 0.0–0.1)
Basophils Relative: 0 %
Eosinophils Absolute: 0.1 10*3/uL (ref 0.0–0.5)
Eosinophils Relative: 1 %
HCT: 41.4 % (ref 39.0–52.0)
Hemoglobin: 13.9 g/dL (ref 13.0–17.0)
Immature Granulocytes: 0 %
Lymphocytes Relative: 13 %
Lymphs Abs: 1.4 10*3/uL (ref 0.7–4.0)
MCH: 28.3 pg (ref 26.0–34.0)
MCHC: 33.6 g/dL (ref 30.0–36.0)
MCV: 84.3 fL (ref 80.0–100.0)
Monocytes Absolute: 0.5 10*3/uL (ref 0.1–1.0)
Monocytes Relative: 5 %
Neutro Abs: 9.2 10*3/uL — ABNORMAL HIGH (ref 1.7–7.7)
Neutrophils Relative %: 81 %
Platelets: 227 10*3/uL (ref 150–400)
RBC: 4.91 MIL/uL (ref 4.22–5.81)
RDW: 12.5 % (ref 11.5–15.5)
WBC: 11.3 10*3/uL — ABNORMAL HIGH (ref 4.0–10.5)
nRBC: 0 % (ref 0.0–0.2)

## 2022-02-24 LAB — COMPREHENSIVE METABOLIC PANEL
ALT: 19 U/L (ref 0–44)
AST: 21 U/L (ref 15–41)
Albumin: 4.1 g/dL (ref 3.5–5.0)
Alkaline Phosphatase: 64 U/L (ref 38–126)
Anion gap: 9 (ref 5–15)
BUN: 9 mg/dL (ref 6–20)
CO2: 25 mmol/L (ref 22–32)
Calcium: 9.2 mg/dL (ref 8.9–10.3)
Chloride: 103 mmol/L (ref 98–111)
Creatinine, Ser: 1.01 mg/dL (ref 0.61–1.24)
GFR, Estimated: >60 mL/min (ref >60)
Glucose, Bld: 114 mg/dL — ABNORMAL HIGH (ref 70–99)
Potassium: 3.7 mmol/L (ref 3.5–5.1)
Sodium: 137 mmol/L (ref 135–145)
Total Bilirubin: 0.3 mg/dL (ref 0.3–1.2)
Total Protein: 7.4 g/dL (ref 6.5–8.1)

## 2022-02-24 LAB — URINALYSIS, ROUTINE W REFLEX MICROSCOPIC
Bilirubin Urine: NEGATIVE
Glucose, UA: NEGATIVE mg/dL
Hgb urine dipstick: NEGATIVE
Ketones, ur: NEGATIVE mg/dL
Leukocytes,Ua: NEGATIVE
Nitrite: NEGATIVE
Protein, ur: NEGATIVE mg/dL
Specific Gravity, Urine: 1.016 (ref 1.005–1.030)
pH: 6 (ref 5.0–8.0)

## 2022-02-24 LAB — LIPASE, BLOOD: Lipase: 31 U/L (ref 11–51)

## 2022-02-24 LAB — RESP PANEL BY RT-PCR (RSV, FLU A&B, COVID)  RVPGX2
Influenza A by PCR: NEGATIVE
Influenza B by PCR: NEGATIVE
Resp Syncytial Virus by PCR: NEGATIVE
SARS Coronavirus 2 by RT PCR: NEGATIVE

## 2022-02-24 LAB — CBG MONITORING, ED: Glucose-Capillary: 121 mg/dL — ABNORMAL HIGH (ref 70–99)

## 2022-02-24 MED ORDER — MORPHINE SULFATE (PF) 4 MG/ML IV SOLN: 4.0000 mg | Freq: Once | INTRAVENOUS | Status: DC

## 2022-02-24 MED ORDER — KETOROLAC TROMETHAMINE 30 MG/ML IJ SOLN
30.0000 mg | Freq: Once | INTRAMUSCULAR | Status: DC
  Filled 2022-02-24: qty 1

## 2022-02-24 MED ORDER — ONDANSETRON HCL 4 MG/2ML IJ SOLN
4.0000 mg | Freq: Once | INTRAMUSCULAR | Status: AC
  Administered 2022-02-24: 4 mg via INTRAVENOUS
  Filled 2022-02-24: qty 2

## 2022-02-24 MED ORDER — KETOROLAC TROMETHAMINE 30 MG/ML IJ SOLN
30.0000 mg | Freq: Once | INTRAMUSCULAR | Status: AC
Start: 2022-02-24 — End: 2022-02-24
  Administered 2022-02-24: 30 mg via INTRAVENOUS
  Filled 2022-02-24: qty 1

## 2022-02-24 MED ORDER — SODIUM CHLORIDE 0.9 % IV BOLUS
1000.0000 mL | Freq: Once | INTRAVENOUS | Status: AC
  Administered 2022-02-24: 1000 mL via INTRAVENOUS

## 2022-02-24 NOTE — ED Provider Notes (Signed)
  Physical Exam  BP (!) 150/76 (BP Location: Left Arm)   Pulse (!) 55   Temp 97.6 F (36.4 C) (Oral)   Resp 18   SpO2 100%   Physical Exam  Procedures  Procedures  ED Course / MDM    Medical Decision Making Amount and/or Complexity of Data Reviewed Radiology: ordered.  Risk Prescription drug management.   18M, presenting with LLQ abdominal pain. Initially presented to [facility], went unresponsive in the car en route. Labs drawn at [facility], reassuring. CT Stone study without kidney stone. UA normal.  Reassess pain.  Prior to reassessment, I was informed that the patient eloped from the emergency department.       Ernie Avena, MD 02/24/22 [phone]

## 2022-02-24 NOTE — ED Provider Triage Note (Signed)
  Emergency Medicine Provider Triage Evaluation Note  MRN:  [id number]  Arrival date & time: 02/24/22    Medically screening exam initiated at 5:33 AM.   CC:   Abdominal pain  HPI:  Eddie [name] is a 31 y.o. year-old male presents to the ED with chief complaint of left lower abdominal pain and flank pain.  Onset this morning.  Worsened with urination.  Denies hx of kidney stones.  Received of Fentanyl from EMS with improvement of symptoms.  History provided by patient. ROS:  -As included in HPI PE:   Vitals:   02/24/22 0530  BP: (!) 162/82  Pulse: 60  Resp: 20  Temp: 97.9 F (36.6 C)  SpO2: 100%    Non-toxic appearing No respiratory distress  MDM:  Based on signs and symptoms, kidney stone is highest on my differential, followed by UTI. I've ordered labs and imaging in triage to expedite lab/diagnostic workup.  Patient was informed that the remainder of the evaluation will be completed by another provider, this initial triage assessment does not replace that evaluation, and the importance of remaining in the ED until their evaluation is complete.    Roxy Horseman, PA-C 02/24/22 [phone]

## 2022-02-24 NOTE — ED Triage Notes (Addendum)
Pt reporting lower abdominal pain, started today, diarrhea, vomiting. Denies fevers or difficulty urinating. Denies difficulty urinating. Pt comes from [facility] with IV in place in the left ac.   Pt wife reported pt was unconscious in car, said they just left [facility] ED. had to be assisted out of vehicle by staff, noted to be blinking eyes and had strong radial pulse, responding to noxious stimuli.

## 2022-02-24 NOTE — ED Triage Notes (Signed)
Patient arrived with EMS from home reports LLQ abdominal pain with nausea onset this morning , he received fentanyl 50 mcg. and Zofran 4 mg IV by EMS , denies hematuria or dysuria .

## 2022-02-24 NOTE — ED Provider Notes (Signed)
[facility]-EMERGENCY DEPT Provider Note   CSN: [id number] Arrival date & time: 02/24/22  7829     History  Chief Complaint  Patient presents with   Abdominal Pain    Eddie [name] is a 31 y.o. male.  The history is provided by the patient and medical records.  Abdominal Pain He has history of seizure disorder currently not on anticonvulsant therapy and comes in because of left lower quadrant pain which started last night.  He had gone to [facility] emergency department where some medication was ordered, but apparently not given.  He got concerned about the wait time and then came over here.  He became unresponsive in the car and route, but came around without any specific treatment.  He does not think he had another seizure.  He does endorse nausea and vomiting overnight.  He denies any urinary difficulty.  He did receive some fentanyl in the ambulance going to [facility], and he does not think that it helped his pain.   Home Medications Prior to Admission medications   Medication Sig Start Date End Date Taking? Authorizing Provider  levETIRAcetam (KEPPRA) 500 MG tablet Take 1 tablet (500 mg total) by mouth 2 (two) times daily. 03/03/21   Cleora Fleet, MD      Allergies    Dilantin [phenytoin sodium extended], Tegretol [carbamazepine], and Divalproex sodium    Review of Systems   Review of Systems  Gastrointestinal:  Positive for abdominal pain.  All other systems reviewed and are negative.   Physical Exam Updated Vital Signs BP (!) 165/80   Pulse 60   Resp 16   SpO2 98%  Physical Exam Vitals and nursing note reviewed.   31 year old male, appears uncomfortable, but is in no acute distress. Vital signs are significant for elevated blood pressure. Oxygen saturation is 98%, which is normal. Head is normocephalic and atraumatic. PERRLA, EOMI. Oropharynx is clear. Neck is nontender and supple without adenopathy or  JVD. Back is nontender and there is no CVA tenderness. Lungs are clear without rales, wheezes, or rhonchi. Chest is nontender. Heart has regular rate and rhythm without murmur. Abdomen is soft, flat, with moderate left lower quadrant tenderness.  There is no rebound or guarding. Extremities have no cyanosis or edema, full range of motion is present. Skin is mildly diaphoretic without rash. Neurologic:'s Sleepy but easy to arouse, able to converse and answer questions appropriately.  Moves all extremities equally.  ED Results / Procedures / Treatments   Labs (all labs ordered are listed, but only abnormal results are displayed) Labs Reviewed  CBG MONITORING, ED - Abnormal; Notable for the following components:      Result Value   Glucose-Capillary 121 (*)    All other components within normal limits  RESP PANEL BY RT-PCR (RSV, FLU A&B, COVID)  [id number]   Radiology CT Renal Stone Study  Result Date: 02/24/2022 CLINICAL DATA:  Abdominal/flank pain. EXAM: CT ABDOMEN AND PELVIS WITHOUT CONTRAST TECHNIQUE: Multidetector CT imaging of the abdomen and pelvis was performed following the standard protocol without IV contrast. RADIATION DOSE REDUCTION: This exam was performed according to the departmental dose-optimization program which includes automated exposure control, adjustment of the mA and/or kV according to patient size and/or use of iterative reconstruction technique. COMPARISON:  None Available. FINDINGS: Lower chest: No acute abnormality. Hepatobiliary: No focal liver abnormality is seen. No gallstones, gallbladder wall thickening, or biliary dilatation. Pancreas: Unremarkable. No pancreatic ductal dilatation or surrounding  inflammatory changes. Spleen: Normal in size without focal abnormality. Adrenals/Urinary Tract: Normal adrenal glands. No nephrolithiasis, hydronephrosis or suspicious mass. No hydronephrosis, hydroureter or ureteral calculi. Bladder appears normal. Stomach/Bowel: Stomach  appears within normal limits. The appendix is visualized and appears normal. Equivocal wall thickening versus incomplete distension of the distal transverse colon and splenic flexure. No surrounding inflammatory fat stranding or free fluid. The remaining bowel loops are unremarkable. Vascular/Lymphatic: No significant vascular findings are present. No enlarged abdominal or pelvic lymph nodes. Reproductive: Prostate is unremarkable. Other: There is a fat containing right inguinal hernia. No free fluid or fluid collections identified. Musculoskeletal: No acute or significant osseous findings. IMPRESSION: 1. No signs of nephrolithiasis or hydronephrosis. 2. Equivocal wall thickening versus incomplete distension of the distal transverse colon and splenic flexure. No surrounding inflammatory fat stranding or free fluid. Correlate for any clinical signs or symptoms of colitis. 3. Fat containing right inguinal hernia. Electronically Signed   By: Kerby Moors M.D.   On: 02/24/2022 07:23    Procedures Procedures  Cardiac monitor shows normal sinus rhythm, per my interpretation.  Medications Ordered in ED Medications  ketorolac (TORADOL) 30 MG/ML injection 30 mg (30 mg Intravenous Not Given 02/24/22 0651)  morphine (PF) 4 MG/ML injection 4 mg (has no administration in time range)  morphine (PF) 4 MG/ML injection 4 mg (has no administration in time range)  sodium chloride 0.9 % bolus 1,000 mL (1,000 mLs Intravenous New Bag/Given 02/24/22 0650)  ondansetron (ZOFRAN) injection 4 mg (4 mg Intravenous Given 02/24/22 [id number])    ED Course/ Medical Decision Making/ A&P                           Medical Decision Making Amount and/or Complexity of Data Reviewed Radiology: ordered.  Risk Prescription drug management.   Left lower quadrant pain.  Transient decreased level of consciousness which probably was a vasovagal episode.  I do not see any evidence of any a recurrent seizure.  Abdominal pain concerning for  possible kidney stone.  Can consider diverticulitis, pyelonephritis, hernia.  I am able to view the results of labs drawn at [facility].  My interpretation of these labs are normal urinalysis, minimal leukocytosis with left shift which is nonspecific, mildly elevated random glucose level and otherwise normal comprehensive metabolic panel and lipase.  I have ordered IV fluids, ketorolac, ondansetron.  Ketorolac was ordered at [facility] but it does not appear that it was given.  I have ordered a renal stone protocol CT scan of the abdomen and pelvis.  I have reviewed his past records, and in 03/02/2021 he was admitted at [facility] of for seizure and discharged with a prescription for levetiracetam and outpatient neurology follow-up.  CT scan shows no evidence of urolithiasis, no acute process.  I went up and reviewed the images, and agree with radiologist interpretation.  Patient seems to be resting comfortably, but when he awakens, he continues to complain of severe left lower quadrant pain and he does demonstrate significant left lower quadrant tenderness.  I have ordered additional morphine and patient will need to be reevaluated.  Case is signed out to Dr. Armandina Gemma.  Final Clinical Impression(s) / ED Diagnoses Final diagnoses:  LLQ abdominal pain    Rx / DC Orders ED Discharge Orders     None         Delora Fuel, MD [id number] [phone]

## 2023-08-26 IMAGING — CT CT HEAD W/O CM
3 series · 16 of 47 positions shown, 19 images · non-contrast
Comparison: 05/29/2009

CLINICAL DATA: Found unresponsive, initial encounter

EXAM:
CT HEAD WITHOUT CONTRAST
TECHNIQUE: Contiguous axial images were obtained from the base of the skull
through the vertex without intravenous contrast.

[Series 3: head w o · axial · 0.48mm/px · z∈[-129,+16]mm · 10 of 35 slices shown, 13 images]
[im 3/35  brain]
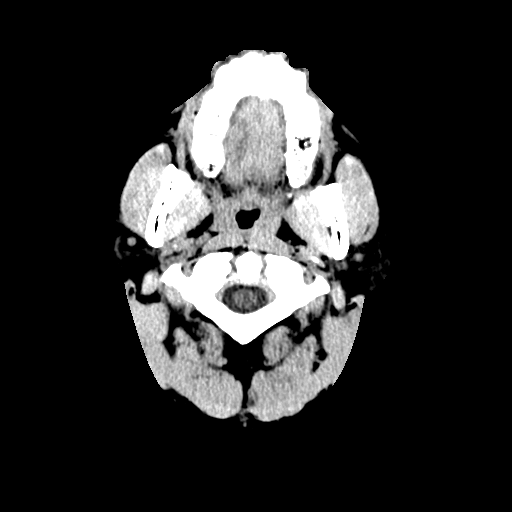
[im 3/35  bone]
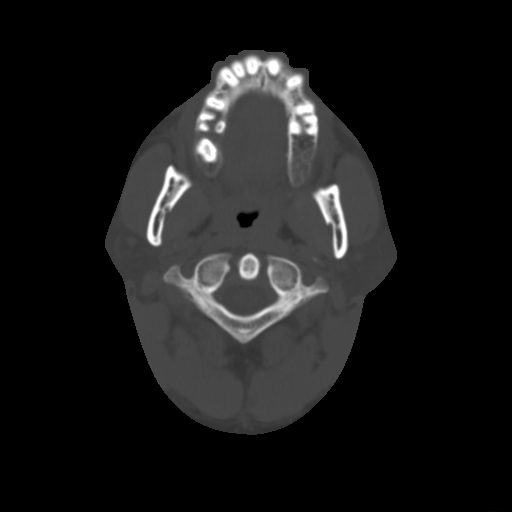
[im 6/35  brain]
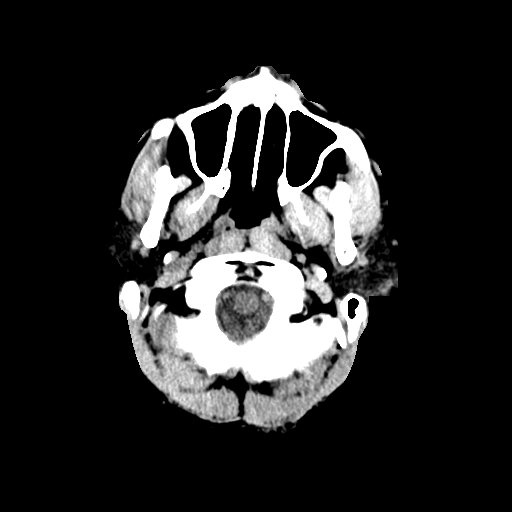
[im 10/35  brain]
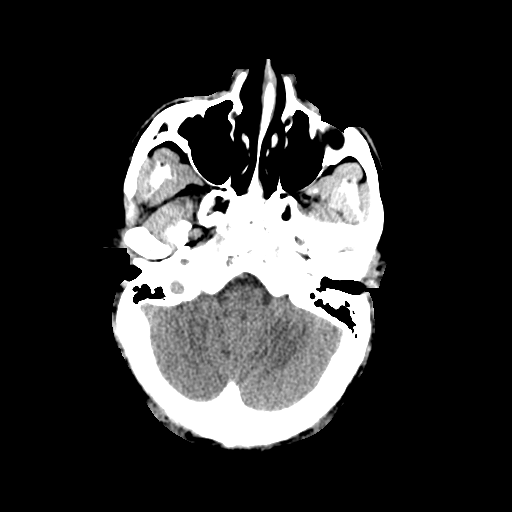
[im 12/35  brain]
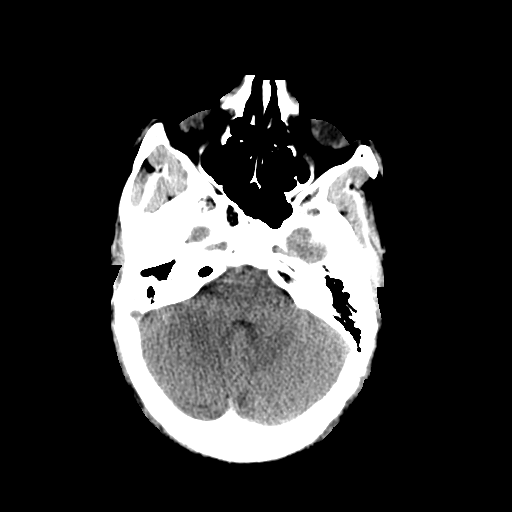
[im 16/35  brain]
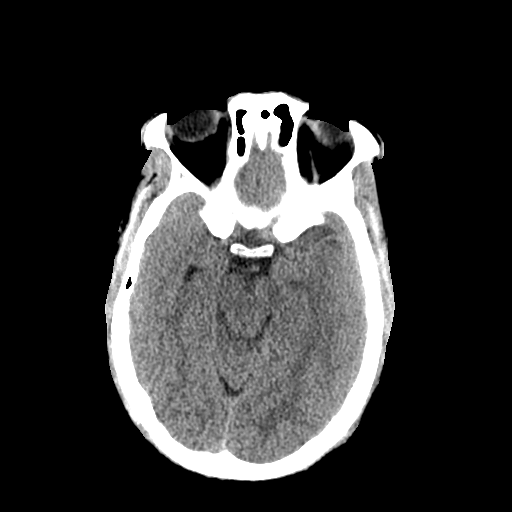
[im 16/35  bone]
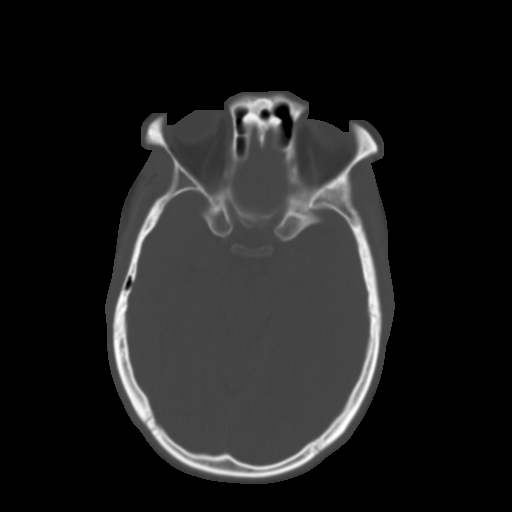
[im 19/35  brain]
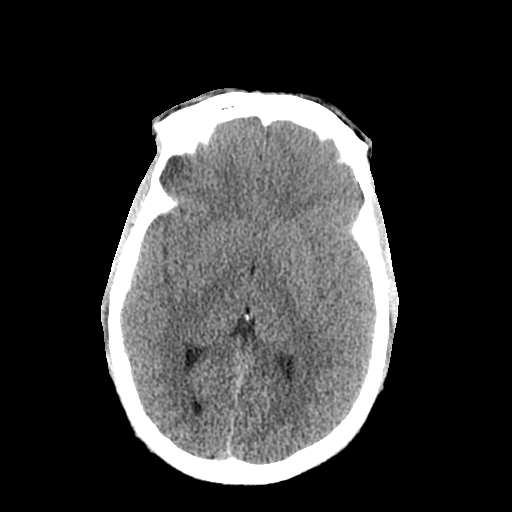
[im 23/35  brain]
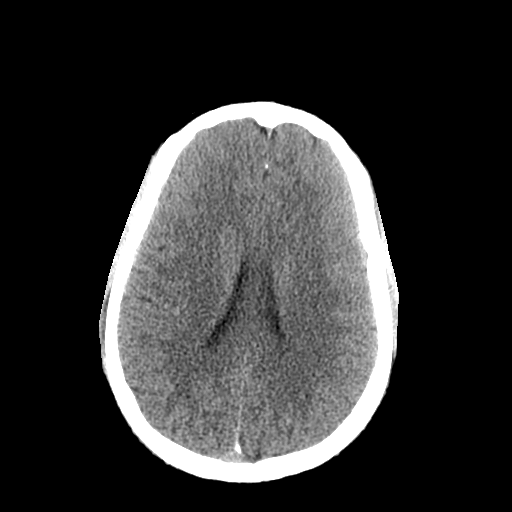
[im 26/35  brain]
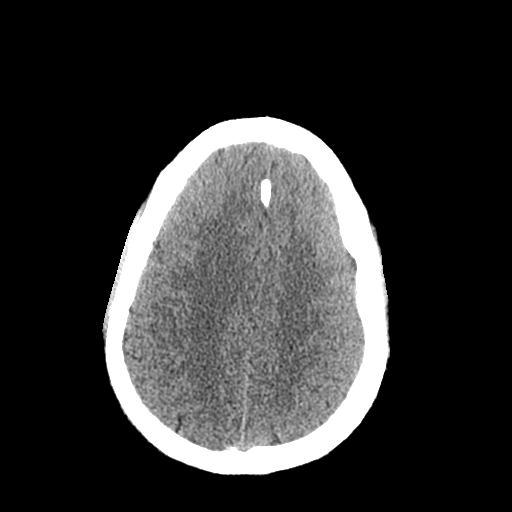
[im 29/35  brain]
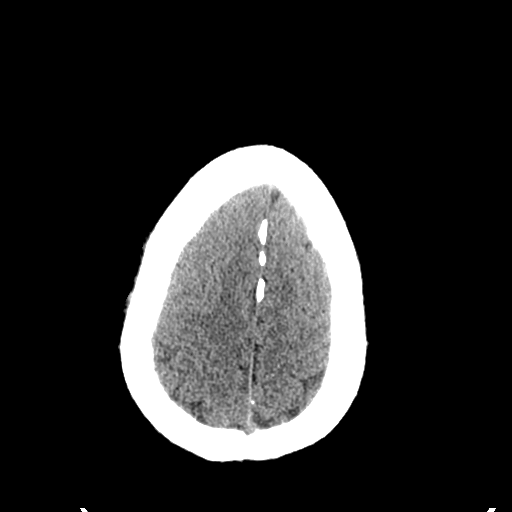
[im 29/35  bone]
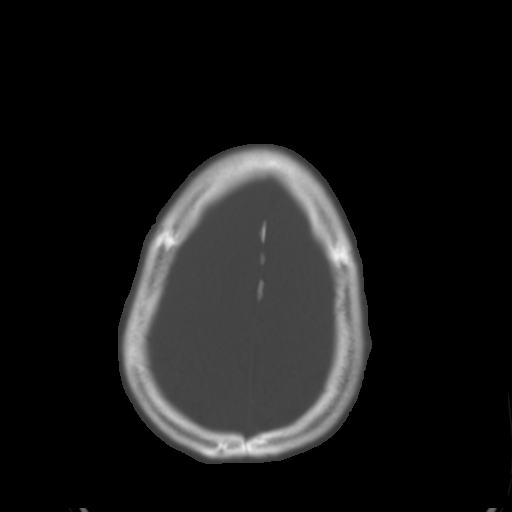
[im 32/35  brain]
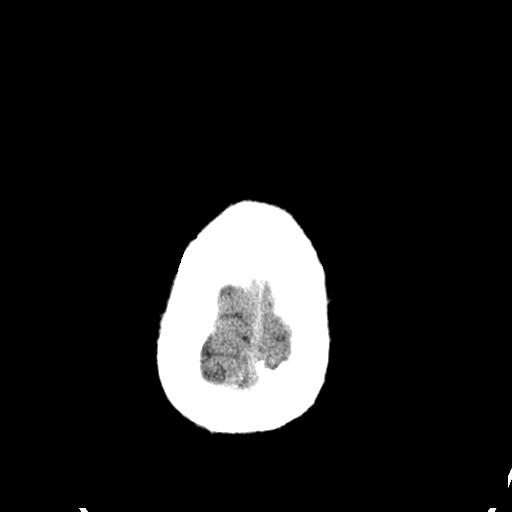

[Series 5: coronal soft · coronal · 0.35mm/px · 3 of 83 slices shown]
[im 28/83  brain]
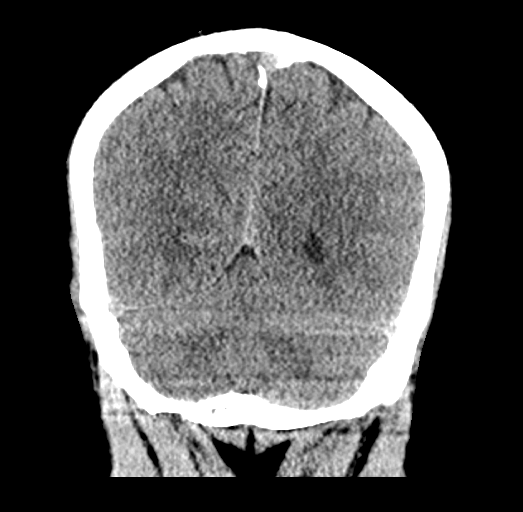
[im 37/83  brain]
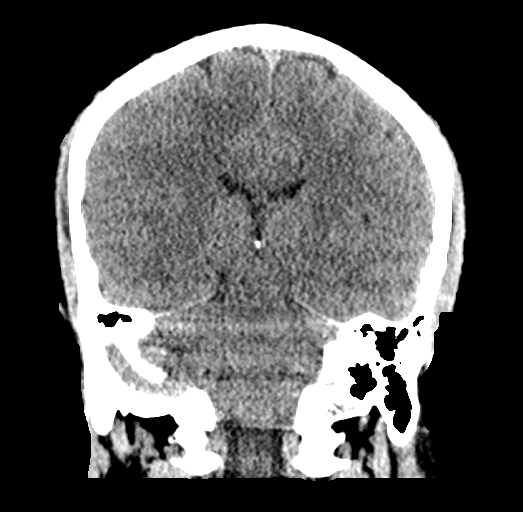
[im 46/83  brain]
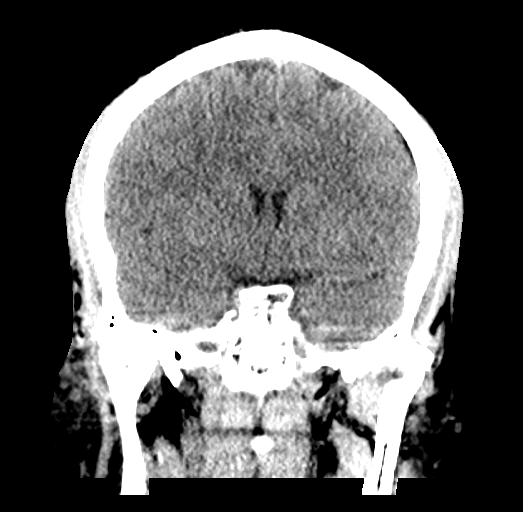

[Series 6: sagittal soft · sagittal · 0.38mm/px · 3 of 70 slices shown]
[im 24/70  brain]
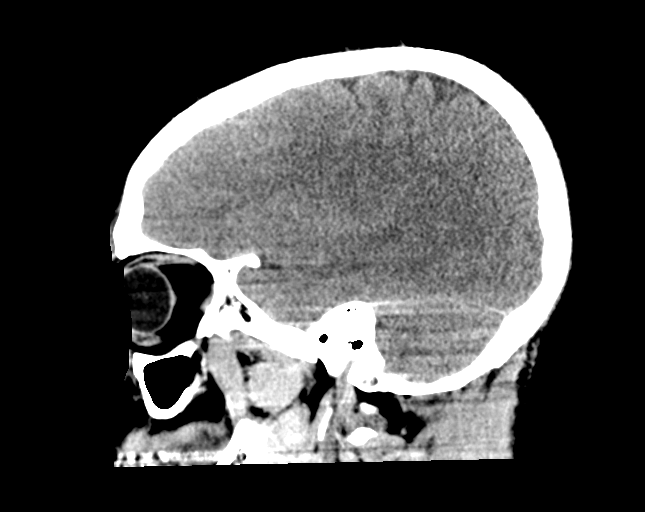
[im 35/70  brain]
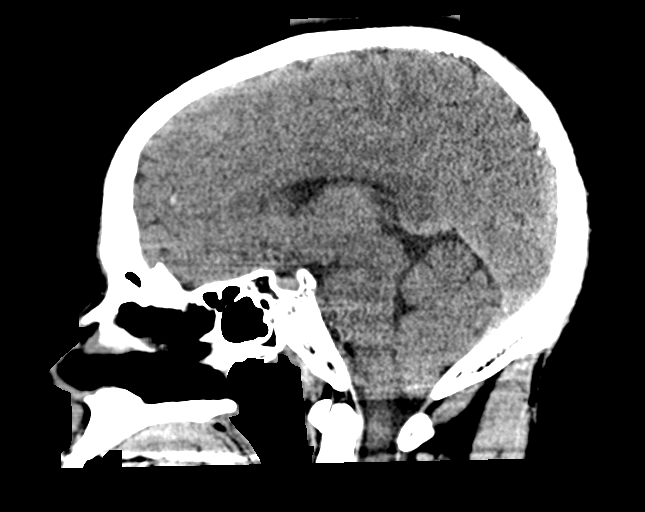
[im 47/70  brain]
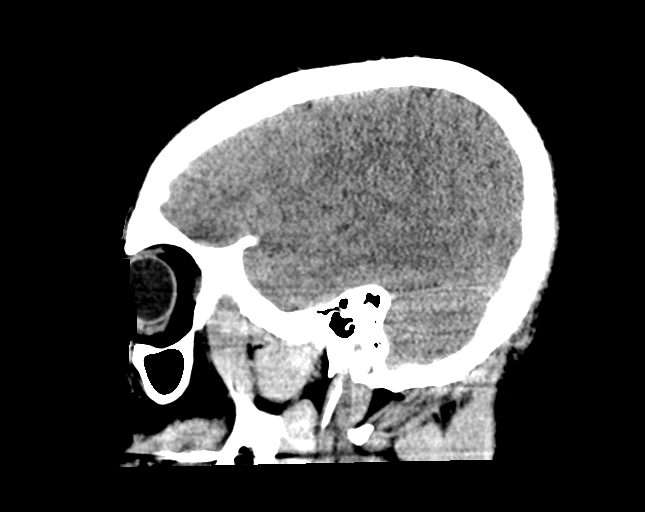

[16 of 47 positions shown; findings below may reference images not displayed]

FINDINGS: Brain: No evidence of acute infarction, hemorrhage, hydrocephalus,
extra-axial collection or mass lesion/mass effect.

Vascular: No hyperdense vessel or unexpected calcification.

Skull: Normal. Negative for fracture or focal lesion.

Sinuses/Orbits: No acute finding.

Other: None.
IMPRESSION: No acute intracranial abnormality noted.

## 2023-11-05 ENCOUNTER — Ambulatory Visit: Payer: Self-pay
